# Patient Record
Sex: Female | Born: 1975 | Race: Black or African American | Hispanic: No | Marital: Married | Smoking: Never smoker
Health system: Southern US, Community
[De-identification: ages and names within clinical notes are randomized; demographics above are authoritative.]

## PROBLEM LIST (undated history)

## (undated) DIAGNOSIS — E282 Polycystic ovarian syndrome: Secondary | ICD-10-CM

## (undated) DIAGNOSIS — D649 Anemia, unspecified: Secondary | ICD-10-CM

## (undated) HISTORY — DX: Polycystic ovarian syndrome: E28.2

---

## 1999-07-30 ENCOUNTER — Encounter: Admission: RE | Admit: 1999-07-30 | Discharge: 1999-07-30 | Payer: Self-pay | Admitting: Family Medicine

## 1999-07-30 ENCOUNTER — Encounter: Payer: Self-pay | Admitting: Family Medicine

## 1999-12-04 ENCOUNTER — Encounter: Admission: RE | Admit: 1999-12-04 | Discharge: 1999-12-04 | Payer: Self-pay | Admitting: Oncology

## 1999-12-04 ENCOUNTER — Encounter: Payer: Self-pay | Admitting: Gastroenterology

## 1999-12-24 ENCOUNTER — Ambulatory Visit (HOSPITAL_COMMUNITY): Admission: RE | Admit: 1999-12-24 | Discharge: 1999-12-24 | Payer: Self-pay | Admitting: Gastroenterology

## 1999-12-24 ENCOUNTER — Encounter: Payer: Self-pay | Admitting: Gastroenterology

## 2000-01-21 ENCOUNTER — Encounter: Payer: Self-pay | Admitting: Gastroenterology

## 2000-01-21 ENCOUNTER — Encounter: Admission: RE | Admit: 2000-01-21 | Discharge: 2000-01-21 | Payer: Self-pay | Admitting: Gastroenterology

## 2001-06-22 ENCOUNTER — Encounter: Admission: RE | Admit: 2001-06-22 | Discharge: 2001-06-22 | Payer: Self-pay

## 2001-08-01 ENCOUNTER — Other Ambulatory Visit: Admission: RE | Admit: 2001-08-01 | Discharge: 2001-08-01 | Payer: Self-pay | Admitting: Family Medicine

## 2001-09-01 ENCOUNTER — Ambulatory Visit (HOSPITAL_COMMUNITY): Admission: RE | Admit: 2001-09-01 | Discharge: 2001-09-01 | Payer: Self-pay

## 2001-11-24 ENCOUNTER — Ambulatory Visit (HOSPITAL_COMMUNITY): Admission: RE | Admit: 2001-11-24 | Discharge: 2001-11-24 | Payer: Self-pay

## 2002-02-12 ENCOUNTER — Ambulatory Visit (HOSPITAL_COMMUNITY): Admission: RE | Admit: 2002-02-12 | Discharge: 2002-02-12 | Payer: Self-pay

## 2002-04-16 ENCOUNTER — Encounter (HOSPITAL_COMMUNITY): Admission: RE | Admit: 2002-04-16 | Discharge: 2002-04-16 | Payer: Self-pay

## 2002-04-19 ENCOUNTER — Inpatient Hospital Stay (HOSPITAL_COMMUNITY): Admission: AD | Admit: 2002-04-19 | Discharge: 2002-04-22 | Payer: Self-pay

## 2004-03-24 ENCOUNTER — Other Ambulatory Visit: Admission: RE | Admit: 2004-03-24 | Discharge: 2004-03-24 | Payer: Self-pay | Admitting: Family Medicine

## 2004-08-03 ENCOUNTER — Other Ambulatory Visit: Admission: RE | Admit: 2004-08-03 | Discharge: 2004-08-03 | Payer: Self-pay | Admitting: Family Medicine

## 2005-04-02 ENCOUNTER — Other Ambulatory Visit: Admission: RE | Admit: 2005-04-02 | Discharge: 2005-04-02 | Payer: Self-pay | Admitting: Family Medicine

## 2006-05-13 ENCOUNTER — Other Ambulatory Visit: Admission: RE | Admit: 2006-05-13 | Discharge: 2006-05-13 | Payer: Self-pay | Admitting: Family Medicine

## 2007-03-06 ENCOUNTER — Other Ambulatory Visit: Admission: RE | Admit: 2007-03-06 | Discharge: 2007-03-06 | Payer: Self-pay | Admitting: Obstetrics and Gynecology

## 2007-09-19 ENCOUNTER — Other Ambulatory Visit: Admission: RE | Admit: 2007-09-19 | Discharge: 2007-09-19 | Payer: Self-pay | Admitting: Obstetrics and Gynecology

## 2008-06-14 ENCOUNTER — Other Ambulatory Visit: Admission: RE | Admit: 2008-06-14 | Discharge: 2008-06-14 | Payer: Self-pay | Admitting: Obstetrics and Gynecology

## 2008-09-18 ENCOUNTER — Encounter: Admission: RE | Admit: 2008-09-18 | Discharge: 2008-09-18 | Payer: Self-pay | Admitting: Gastroenterology

## 2008-12-17 ENCOUNTER — Other Ambulatory Visit: Admission: RE | Admit: 2008-12-17 | Discharge: 2008-12-17 | Payer: Self-pay | Admitting: Obstetrics and Gynecology

## 2009-06-17 ENCOUNTER — Other Ambulatory Visit: Admission: RE | Admit: 2009-06-17 | Discharge: 2009-06-17 | Payer: Self-pay | Admitting: Nurse Practitioner

## 2010-01-16 ENCOUNTER — Other Ambulatory Visit: Admission: RE | Admit: 2010-01-16 | Discharge: 2010-01-16 | Payer: Self-pay | Admitting: Obstetrics and Gynecology

## 2010-08-16 ENCOUNTER — Inpatient Hospital Stay (HOSPITAL_COMMUNITY)
Admission: AD | Admit: 2010-08-16 | Discharge: 2010-08-18 | DRG: 766 | Disposition: A | Discharge status: Home / Self Care | Payer: 59 | Source: Ambulatory Visit | Attending: Obstetrics and Gynecology | Admitting: Obstetrics and Gynecology

## 2010-08-16 ENCOUNTER — Other Ambulatory Visit: Payer: Self-pay | Admitting: Obstetrics and Gynecology

## 2010-08-16 DIAGNOSIS — O09529 Supervision of elderly multigravida, unspecified trimester: Secondary | ICD-10-CM | POA: Diagnosis present

## 2010-08-16 DIAGNOSIS — O9902 Anemia complicating childbirth: Secondary | ICD-10-CM | POA: Diagnosis present

## 2010-08-16 DIAGNOSIS — D649 Anemia, unspecified: Secondary | ICD-10-CM | POA: Diagnosis present

## 2010-08-16 LAB — CBC
HCT: 35.9 % — ABNORMAL LOW (ref 36.0–46.0)
Hemoglobin: 11.8 g/dL — ABNORMAL LOW (ref 12.0–15.0)
MCH: 27.7 pg (ref 26.0–34.0)
MCHC: 32.9 g/dL (ref 30.0–36.0)
MCV: 84.3 fL (ref 78.0–100.0)
Platelets: 147 K/uL — ABNORMAL LOW (ref 150–400)
RBC: 4.26 MIL/uL (ref 3.87–5.11)
RDW: 16.5 % — ABNORMAL HIGH (ref 11.5–15.5)
WBC: 5.6 K/uL (ref 4.0–10.5)

## 2010-08-16 LAB — SYPHILIS: RPR W/REFLEX TO RPR TITER AND TREPONEMAL ANTIBODIES, TRADITIONAL SCREENING AND DIAGNOSIS ALGORITHM: RPR Ser Ql: NONREACTIVE

## 2010-08-17 LAB — CBC
Hemoglobin: 7.5 g/dL — ABNORMAL LOW (ref 12.0–15.0)
MCHC: 32.9 g/dL (ref 30.0–36.0)
Platelets: 121 10*3/uL — ABNORMAL LOW (ref 150–400)

## 2010-08-25 ENCOUNTER — Inpatient Hospital Stay (HOSPITAL_COMMUNITY)
Admission: AD | Admit: 2010-08-25 | Discharge: 2010-08-25 | Disposition: A | Discharge status: Home / Self Care | Payer: 59 | Source: Ambulatory Visit | Attending: Obstetrics and Gynecology | Admitting: Obstetrics and Gynecology

## 2010-08-25 DIAGNOSIS — R300 Dysuria: Secondary | ICD-10-CM | POA: Insufficient documentation

## 2010-08-25 LAB — CBC
HCT: 23.2 % — ABNORMAL LOW (ref 36.0–46.0)
Platelets: 283 10*3/uL (ref 150–400)
RDW: 16.1 % — ABNORMAL HIGH (ref 11.5–15.5)
WBC: 7.8 10*3/uL (ref 4.0–10.5)

## 2010-08-25 LAB — URINALYSIS, ROUTINE W REFLEX MICROSCOPIC
Glucose, UA: NEGATIVE mg/dL
Nitrite: NEGATIVE
Protein, ur: NEGATIVE mg/dL
Urobilinogen, UA: 0.2 mg/dL (ref 0.0–1.0)

## 2010-08-26 LAB — URINE CULTURE: Culture  Setup Time: 201206052018

## 2010-09-03 NOTE — Op Note (Signed)
Leah Robertson, Leah Robertson            ACCOUNT NO.:  [id number]  MEDICAL RECORD NO.:  [id number]           PATIENT TYPE:  I  LOCATION:  9102                          FACILITY:  [facility]  PHYSICIAN:  Patsy Baltimore, MD     DATE OF BIRTH:  1975-11-23  DATE OF PROCEDURE:  08/16/2010 DATE OF DISCHARGE:                              OPERATIVE REPORT   PREOPERATIVE DIAGNOSIS:  Failure to progress.  POSTOPERATIVE DIAGNOSIS:  Failure to progress.  PROCEDURE PERFORMED:  A primary low transverse cesarean section.  FINDINGS:  Full-term live female child, cephalic presentation, right occiput transverse.  Apgars 8 and 9.  One loose body cord x1.  Moderate meconium.  Infant weight 7 pounds 7 ounces.  Time of delivery 1848.  ESTIMATED BLOOD LOSS:  1200 mL.  IV FLUIDS:  2700 mL.  URINE:  Initially pink tinged, then clear yellow, total 75 mL.  Leah Robertson is a 35 year old gravida 2, para 1-0-0-1 who presented to the MAU with painful contractions and vaginal bleeding. She reported good fetal movement, no leakage of fluid.  On exam, she was 1-2 cm dilated, 50% effaced and -2 station.  The fetal heart tracing was noted to have some variable decelerations.  She was contracting.  She was therefore admitted in labor, augmented with Pitocin.  The fetal heart tracing continued to have intermittent variable decelerations as well as some late.  She was started on Pitocin, however, the Pitocin had to be turned off as resuscitative measures were done.  She progressed to 5 cm and her membranes were ruptured.  She received an epidural and an IUPC and FSE were placed.  The Pitocin never went higher then 5 milliunits per minute.  After this, she progressed to 7 cm and stayed centimeters for 3 hours.  She continued to have repetitive variable decelerations in spite of an amnioinfusion.  More importantly, her cervix thickened and she arrested in dilation. Informed consent was obtained from the patient for a  cesarean section.  The patient verbalized understanding of the sequence of events and gave her consent to the procedure.  The risks of pain, bleeding, infection, damage to internal organs including bladder, bowel, ureter, nerves and vessels was described in detail as well as blood clots and wound infection.  She was given Ancef for infection prophylaxis and she had sequential compression devices for DVT prophylaxis.  Her epidural was bolused for the cesarean section and she was taken to the operating room.  She was placed in the dorsal supine position with a leftward tilt.  She had a Foley catheter that was in her bladder from the labor suite.  At this point, a Foley catheter started draining some pink-tinged urine.  Prior to starting the procedure, the fetal heart rate was in the 140s.  She was prepped and draped in the usual sterile fashion.  A time-out was called and we began the procedure.  The level of her epidural was tested and found to be adequate prior to incision.  A Pfannenstiel skin incision was made, 2 fingerbreadths above the pubic symphysis with the scalpel and carried down to the underlying fascia with the  Bovie.  The bleeding vessels were cauterized as we proceeded.  The subcutaneous fat was cleared off the fascia.  The fascia was nicked in the midline and the fascial entry was extended bilaterally in a curvilinear fashion by tenting upwards with the Kelly clamp and incising with the Bovie.  The inferior aspects of the fascial flap was grasped on either side of the midline with 2 Kocher clamps and underlying pyramidalis muscle and rectus muscle was dissected off down to the pubic bone.  The same was done superiorly.  The rectus muscle was separated in the midline and the peritoneum was entered bluntly.  At this point, an assessment was made of the gravid uterus. There were no adhesions from the uterus to the anterior abdominal wall. There was no bowel in the way.  The  incision was deemed adequate for infant delivery.  A bladder flap was made by taking down the external layer of the vesicouterine reflection.  The Alexis O retractor was then inserted and deployed.  The bladder blade was inserted and used to hold down the bladder.  The uterine incision was made.  The bladder blade was removed.  The infant's head was delivered atraumatically and the rest of the body with fundal pressure.  There was a one loop of umbilical cord wrapped around the infant.  This was reduced easily.  The nose and mouth were suctioned.  The cord was clamped and cut and the baby was handed off to the awaiting pediatricians.  Cord blood was obtained.  The placenta was manually extracted.  The uterus was then cleared of all clots and debris.  Uterine closure was done with 0 chromic in 2  layers. Further bleeding vessels were tied off with figure-of-eight sutures and incision was hemostatic.  Irrigation was done with a small amount of lukewarm normal saline.  Small sheet of Surgicel was used to promote hemostasis along the incision.  The peritoneum was then reapproximated with 2-0 Vicryl.  The fascia was closed with 0 Vicryl in a running continuous fashion.  The subcutaneous tissue was reapproximated with 0 Vicryl as well.  The skin was closed with 4-0 Vicryl in a subcuticular manner.  Dermabond was used to reinforce the closure.  The patient tolerated the procedure well.  At the end, the sponge, lap, needle and instrument counts were correct x2.  The were no complications.  The urine was clear yellow in the Foley catheter.  She was transferred to the PACU in stable condition.          ______________________________ Patsy Baltimore, MD     CO/MEDQ  D:  08/16/2010  T:  08/17/2010  Job:  [id number]  Electronically Signed by Patsy Baltimore MD on 09/03/2010 09:59:14 AM

## 2010-09-25 NOTE — Discharge Summary (Signed)
NAMEICEL, CASTLES            ACCOUNT NO.:  [id number]  MEDICAL RECORD NO.:  [id number]  LOCATION:  9102                          FACILITY:  [facility]  PHYSICIAN:  Patsy Baltimore, MD     DATE OF BIRTH:  06/09/1975  DATE OF ADMISSION:  08/16/2010 DATE OF DISCHARGE:  08/18/2010                              DISCHARGE SUMMARY   PRINCIPAL DIAGNOSES: 1. Term intrauterine pregnancy in latent labor. 2. Advanced maternal age. 3. Herpes. 4. Sickle cell trait. 5. Anemia.  PROCEDURE PERFORMED:  A primary low-transverse cesarean section.  Leah Robertson is a 35 year old gravida 2, para 1-0-0-1 at 40 weeks and 2 days' gestation dated by a 7-week ultrasound, who was admitted to the Labor and Delivery suite with painful contractions and slight vaginal bleeding.  She reported good fetal movement, no leakage of fluid.  Her pregnancy was complicated by advanced maternal age.  She declined an amniocentesis.  She also had herpes and had been on Valtrex since 36 weeks for suppression.  She also has sickle cell trait and her husband was negative.  She had anemia and was also taking iron.  PREVIOUS OBSTETRIC HISTORY:  One normal spontaneous vaginal delivery at 41 weeks in 2004, that baby weighed 7 pounds 4 ounces.  LABORATORY DATA:  She was group B strep negative, hepatitis B surface antigen negative.  Her 1-hour glucose was 100.  She is rubella immune. Platelet count on Jul 28, 2010, was 148.  Blood type O+, antibody screen negative, HIV negative, GC/Chlamydia negative, syphilis negative.  On arrival, her vital signs were stable.  She was uncomfortable as time progressed with more painful contractions.  Therefore, she was admitted in latent labor.  She was started on Pitocin for augmentation and received an epidural for pain management.  She made progress to about 6-7 cm dilation.  She had repetitive variable decelerations.  An amnioinfusion was started to help resolve this.  She had been  given oxygen off and on. The Pitocin was discontinued.  After a period of recovery, the Pitocin was restarted; however, it had to be stopped again due to a nonreassuring fetal heart tracing.  She arrested at 7 cm of dilation and at that point, a diagnosis of failure to progress was made as well as fetal intolerance of the Pitocin.  Informed consent was obtained for a primary low-transverse cesarean section and her epidural was bolused accordingly.  She underwent an uneventful cesarean section.  The estimated blood loss was 1200 mL.  Baby was a full-term live female child in cephalic presentation, Apgars were 8 and 9, 1 loose body cord.  There was moderate meconium.  The baby weighed 7 pounds 7 ounces. Postoperatively, she did fine.  Her pain was controlled with medications.  She reported bottle-feeding.  Her postop hematocrit was 22.5.  Therefore, she was started on iron.  She desired infant circumcision and that was done in the hospital.  She was discharged home per request on postop day #2 because she was feeling okay and wanted [place] home.  She was given strict instructions for monitoring her wound, told to report any fever, chills, or unusual discharge from the wound.  She was discharged home with prescriptions  for iron, Motrin, Percocet, and told to follow up in 2 weeks for an incision check.  There were no unresolved issues at the time of discharge and she verbalized an understanding of the discharge instructions.          ______________________________ Patsy Baltimore, MD     CO/MEDQ  D:  09/21/2010  T:  09/22/2010  Job:  [id number]  Electronically Signed by Patsy Baltimore MD on 09/25/2010 10:33:33 AM

## 2011-02-10 ENCOUNTER — Other Ambulatory Visit: Payer: Self-pay | Admitting: Obstetrics and Gynecology

## 2011-02-10 ENCOUNTER — Other Ambulatory Visit (HOSPITAL_COMMUNITY)
Admission: RE | Admit: 2011-02-10 | Discharge: 2011-02-10 | Disposition: A | Discharge status: Home / Self Care | Payer: Managed Care, Other (non HMO) | Source: Ambulatory Visit | Attending: Obstetrics and Gynecology | Admitting: Obstetrics and Gynecology

## 2011-02-10 DIAGNOSIS — Z01419 Encounter for gynecological examination (general) (routine) without abnormal findings: Secondary | ICD-10-CM | POA: Insufficient documentation

## 2011-07-20 DIAGNOSIS — L650 Telogen effluvium: Secondary | ICD-10-CM | POA: Insufficient documentation

## 2012-03-17 ENCOUNTER — Other Ambulatory Visit: Payer: Self-pay | Admitting: Obstetrics and Gynecology

## 2012-03-17 ENCOUNTER — Other Ambulatory Visit (HOSPITAL_COMMUNITY)
Admission: RE | Admit: 2012-03-17 | Discharge: 2012-03-17 | Disposition: A | Discharge status: Home / Self Care | Payer: Managed Care, Other (non HMO) | Source: Ambulatory Visit | Attending: Obstetrics and Gynecology | Admitting: Obstetrics and Gynecology

## 2012-03-17 DIAGNOSIS — N6320 Unspecified lump in the left breast, unspecified quadrant: Secondary | ICD-10-CM

## 2012-03-17 DIAGNOSIS — Z01419 Encounter for gynecological examination (general) (routine) without abnormal findings: Secondary | ICD-10-CM | POA: Insufficient documentation

## 2012-03-17 DIAGNOSIS — Z1151 Encounter for screening for human papillomavirus (HPV): Secondary | ICD-10-CM | POA: Insufficient documentation

## 2012-03-24 ENCOUNTER — Ambulatory Visit
Admission: RE | Admit: 2012-03-24 | Discharge: 2012-03-24 | Disposition: A | Discharge status: Home / Self Care | Payer: Managed Care, Other (non HMO) | Source: Ambulatory Visit | Attending: Obstetrics and Gynecology | Admitting: Obstetrics and Gynecology

## 2012-03-24 ENCOUNTER — Other Ambulatory Visit: Payer: Self-pay | Admitting: Obstetrics and Gynecology

## 2012-03-24 ENCOUNTER — Ambulatory Visit
Admission: RE | Admit: 2012-03-24 | Discharge: 2012-03-24 | Disposition: A | Discharge status: Home / Self Care | Payer: 59 | Source: Ambulatory Visit | Attending: Obstetrics and Gynecology | Admitting: Obstetrics and Gynecology

## 2012-03-24 DIAGNOSIS — N6320 Unspecified lump in the left breast, unspecified quadrant: Secondary | ICD-10-CM

## 2012-04-05 ENCOUNTER — Other Ambulatory Visit (HOSPITAL_COMMUNITY)
Admission: RE | Admit: 2012-04-05 | Discharge: 2012-04-05 | Disposition: A | Discharge status: Home / Self Care | Payer: Managed Care, Other (non HMO) | Source: Ambulatory Visit | Attending: Obstetrics and Gynecology | Admitting: Obstetrics and Gynecology

## 2012-10-30 ENCOUNTER — Other Ambulatory Visit: Payer: Self-pay | Admitting: Family Medicine

## 2012-10-30 DIAGNOSIS — N6310 Unspecified lump in the right breast, unspecified quadrant: Secondary | ICD-10-CM

## 2012-12-01 ENCOUNTER — Ambulatory Visit
Admission: RE | Admit: 2012-12-01 | Discharge: 2012-12-01 | Disposition: A | Discharge status: Home / Self Care | Payer: 59 | Source: Ambulatory Visit | Attending: Family Medicine | Admitting: Family Medicine

## 2012-12-01 DIAGNOSIS — N6310 Unspecified lump in the right breast, unspecified quadrant: Secondary | ICD-10-CM

## 2013-04-09 ENCOUNTER — Other Ambulatory Visit (HOSPITAL_COMMUNITY)
Admission: RE | Admit: 2013-04-09 | Discharge: 2013-04-09 | Disposition: A | Discharge status: Home / Self Care | Payer: 59 | Source: Ambulatory Visit | Attending: Obstetrics and Gynecology | Admitting: Obstetrics and Gynecology

## 2013-04-09 ENCOUNTER — Other Ambulatory Visit: Payer: Self-pay | Admitting: Obstetrics and Gynecology

## 2013-04-09 DIAGNOSIS — R87810 Cervical high risk human papillomavirus (HPV) DNA test positive: Secondary | ICD-10-CM | POA: Insufficient documentation

## 2013-04-09 DIAGNOSIS — Z1151 Encounter for screening for human papillomavirus (HPV): Secondary | ICD-10-CM | POA: Insufficient documentation

## 2013-04-09 DIAGNOSIS — Z124 Encounter for screening for malignant neoplasm of cervix: Secondary | ICD-10-CM | POA: Insufficient documentation

## 2013-05-03 ENCOUNTER — Other Ambulatory Visit: Payer: Self-pay | Admitting: Obstetrics and Gynecology

## 2014-02-27 ENCOUNTER — Other Ambulatory Visit: Payer: Self-pay | Admitting: Obstetrics and Gynecology

## 2014-02-27 DIAGNOSIS — N6310 Unspecified lump in the right breast, unspecified quadrant: Secondary | ICD-10-CM

## 2014-02-27 DIAGNOSIS — N6320 Unspecified lump in the left breast, unspecified quadrant: Secondary | ICD-10-CM

## 2014-03-11 DIAGNOSIS — L669 Cicatricial alopecia, unspecified: Secondary | ICD-10-CM | POA: Insufficient documentation

## 2014-03-12 ENCOUNTER — Ambulatory Visit
Admission: RE | Admit: 2014-03-12 | Discharge: 2014-03-12 | Disposition: A | Discharge status: Home / Self Care | Payer: 59 | Source: Ambulatory Visit | Attending: Obstetrics and Gynecology | Admitting: Obstetrics and Gynecology

## 2014-03-12 DIAGNOSIS — N6310 Unspecified lump in the right breast, unspecified quadrant: Secondary | ICD-10-CM

## 2014-05-01 ENCOUNTER — Other Ambulatory Visit: Payer: Self-pay | Admitting: Obstetrics and Gynecology

## 2014-05-01 ENCOUNTER — Other Ambulatory Visit (HOSPITAL_COMMUNITY)
Admission: RE | Admit: 2014-05-01 | Discharge: 2014-05-01 | Disposition: A | Discharge status: Home / Self Care | Payer: 59 | Source: Ambulatory Visit | Attending: Obstetrics and Gynecology | Admitting: Obstetrics and Gynecology

## 2014-05-01 DIAGNOSIS — Z1151 Encounter for screening for human papillomavirus (HPV): Secondary | ICD-10-CM | POA: Insufficient documentation

## 2014-05-01 DIAGNOSIS — R87810 Cervical high risk human papillomavirus (HPV) DNA test positive: Secondary | ICD-10-CM | POA: Insufficient documentation

## 2014-05-01 DIAGNOSIS — Z01411 Encounter for gynecological examination (general) (routine) with abnormal findings: Secondary | ICD-10-CM | POA: Insufficient documentation

## 2014-05-03 LAB — CYTOLOGY - PAP

## 2014-06-27 ENCOUNTER — Other Ambulatory Visit: Payer: Self-pay | Admitting: Family Medicine

## 2014-06-27 DIAGNOSIS — R1031 Right lower quadrant pain: Secondary | ICD-10-CM

## 2014-07-04 ENCOUNTER — Ambulatory Visit
Admission: RE | Admit: 2014-07-04 | Discharge: 2014-07-04 | Disposition: A | Discharge status: Home / Self Care | Payer: Managed Care, Other (non HMO) | Source: Ambulatory Visit | Attending: Family Medicine | Admitting: Family Medicine

## 2014-07-04 DIAGNOSIS — R1031 Right lower quadrant pain: Secondary | ICD-10-CM

## 2014-07-04 MED ORDER — IOPAMIDOL (ISOVUE-300) INJECTION 61%
100.0000 mL | Freq: Once | INTRAVENOUS | Status: AC | PRN
  Administered 2014-07-04: 100 mL via INTRAVENOUS

## 2014-12-26 DIAGNOSIS — E611 Iron deficiency: Secondary | ICD-10-CM | POA: Insufficient documentation

## 2015-01-09 ENCOUNTER — Other Ambulatory Visit: Payer: Self-pay | Admitting: Family Medicine

## 2015-01-09 DIAGNOSIS — N2889 Other specified disorders of kidney and ureter: Secondary | ICD-10-CM

## 2015-01-27 ENCOUNTER — Ambulatory Visit
Admission: RE | Admit: 2015-01-27 | Discharge: 2015-01-27 | Disposition: A | Discharge status: Home / Self Care | Payer: 59 | Source: Ambulatory Visit | Attending: Family Medicine | Admitting: Family Medicine

## 2015-01-27 DIAGNOSIS — N2889 Other specified disorders of kidney and ureter: Secondary | ICD-10-CM

## 2015-03-06 ENCOUNTER — Other Ambulatory Visit: Payer: Self-pay

## 2015-03-06 DIAGNOSIS — Z1231 Encounter for screening mammogram for malignant neoplasm of breast: Secondary | ICD-10-CM

## 2015-03-26 ENCOUNTER — Ambulatory Visit
Admission: RE | Admit: 2015-03-26 | Discharge: 2015-03-26 | Disposition: A | Discharge status: Home / Self Care | Payer: 59 | Source: Ambulatory Visit

## 2015-03-26 DIAGNOSIS — Z1231 Encounter for screening mammogram for malignant neoplasm of breast: Secondary | ICD-10-CM

## 2015-05-02 ENCOUNTER — Other Ambulatory Visit: Payer: Self-pay | Admitting: Obstetrics and Gynecology

## 2015-05-02 ENCOUNTER — Other Ambulatory Visit (HOSPITAL_COMMUNITY)
Admission: RE | Admit: 2015-05-02 | Discharge: 2015-05-02 | Disposition: A | Discharge status: Home / Self Care | Payer: 59 | Source: Ambulatory Visit | Attending: Obstetrics and Gynecology | Admitting: Obstetrics and Gynecology

## 2015-05-02 DIAGNOSIS — Z01411 Encounter for gynecological examination (general) (routine) with abnormal findings: Secondary | ICD-10-CM | POA: Insufficient documentation

## 2015-05-02 DIAGNOSIS — Z1151 Encounter for screening for human papillomavirus (HPV): Secondary | ICD-10-CM | POA: Diagnosis present

## 2015-05-05 LAB — CYTOLOGY - PAP

## 2015-07-15 ENCOUNTER — Other Ambulatory Visit: Payer: Self-pay | Admitting: Obstetrics and Gynecology

## 2016-05-18 DIAGNOSIS — R6883 Chills (without fever): Secondary | ICD-10-CM | POA: Diagnosis not present

## 2016-05-18 DIAGNOSIS — R5383 Other fatigue: Secondary | ICD-10-CM | POA: Diagnosis not present

## 2016-05-24 ENCOUNTER — Other Ambulatory Visit: Payer: Self-pay | Admitting: Family Medicine

## 2016-05-24 DIAGNOSIS — Z1231 Encounter for screening mammogram for malignant neoplasm of breast: Secondary | ICD-10-CM

## 2016-06-17 ENCOUNTER — Ambulatory Visit
Admission: RE | Admit: 2016-06-17 | Discharge: 2016-06-17 | Disposition: A | Discharge status: Home / Self Care | Payer: 59 | Source: Ambulatory Visit | Attending: Family Medicine | Admitting: Family Medicine

## 2016-06-17 DIAGNOSIS — Z1231 Encounter for screening mammogram for malignant neoplasm of breast: Secondary | ICD-10-CM

## 2016-06-18 ENCOUNTER — Other Ambulatory Visit: Payer: Self-pay | Admitting: Family Medicine

## 2016-06-18 DIAGNOSIS — R928 Other abnormal and inconclusive findings on diagnostic imaging of breast: Secondary | ICD-10-CM

## 2016-06-20 HISTORY — PX: BREAST BIOPSY: SHX20

## 2016-06-23 ENCOUNTER — Ambulatory Visit
Admission: RE | Admit: 2016-06-23 | Discharge: 2016-06-23 | Disposition: A | Discharge status: Home / Self Care | Payer: 59 | Source: Ambulatory Visit | Attending: Family Medicine | Admitting: Family Medicine

## 2016-06-23 ENCOUNTER — Other Ambulatory Visit: Payer: Self-pay | Admitting: Family Medicine

## 2016-06-23 DIAGNOSIS — N6320 Unspecified lump in the left breast, unspecified quadrant: Secondary | ICD-10-CM

## 2016-06-23 DIAGNOSIS — R590 Localized enlarged lymph nodes: Secondary | ICD-10-CM | POA: Diagnosis not present

## 2016-06-23 DIAGNOSIS — N6322 Unspecified lump in the left breast, upper inner quadrant: Secondary | ICD-10-CM | POA: Diagnosis not present

## 2016-06-23 DIAGNOSIS — D242 Benign neoplasm of left breast: Secondary | ICD-10-CM | POA: Diagnosis not present

## 2016-06-23 DIAGNOSIS — R928 Other abnormal and inconclusive findings on diagnostic imaging of breast: Secondary | ICD-10-CM

## 2016-06-23 DIAGNOSIS — N6489 Other specified disorders of breast: Secondary | ICD-10-CM | POA: Diagnosis not present

## 2016-07-07 DIAGNOSIS — N951 Menopausal and female climacteric states: Secondary | ICD-10-CM | POA: Diagnosis not present

## 2016-07-13 DIAGNOSIS — E559 Vitamin D deficiency, unspecified: Secondary | ICD-10-CM | POA: Diagnosis not present

## 2016-07-13 DIAGNOSIS — N951 Menopausal and female climacteric states: Secondary | ICD-10-CM | POA: Diagnosis not present

## 2016-07-13 DIAGNOSIS — E539 Vitamin B deficiency, unspecified: Secondary | ICD-10-CM | POA: Diagnosis not present

## 2016-07-19 DIAGNOSIS — E559 Vitamin D deficiency, unspecified: Secondary | ICD-10-CM | POA: Diagnosis not present

## 2016-07-19 DIAGNOSIS — E539 Vitamin B deficiency, unspecified: Secondary | ICD-10-CM | POA: Diagnosis not present

## 2016-07-26 DIAGNOSIS — E559 Vitamin D deficiency, unspecified: Secondary | ICD-10-CM | POA: Diagnosis not present

## 2016-07-26 DIAGNOSIS — R5383 Other fatigue: Secondary | ICD-10-CM | POA: Diagnosis not present

## 2016-08-02 DIAGNOSIS — R5383 Other fatigue: Secondary | ICD-10-CM | POA: Diagnosis not present

## 2016-08-02 DIAGNOSIS — E559 Vitamin D deficiency, unspecified: Secondary | ICD-10-CM | POA: Diagnosis not present

## 2016-08-09 DIAGNOSIS — E559 Vitamin D deficiency, unspecified: Secondary | ICD-10-CM | POA: Diagnosis not present

## 2016-08-17 DIAGNOSIS — E559 Vitamin D deficiency, unspecified: Secondary | ICD-10-CM | POA: Diagnosis not present

## 2016-08-23 DIAGNOSIS — E559 Vitamin D deficiency, unspecified: Secondary | ICD-10-CM | POA: Diagnosis not present

## 2016-08-30 DIAGNOSIS — E559 Vitamin D deficiency, unspecified: Secondary | ICD-10-CM | POA: Diagnosis not present

## 2016-09-06 DIAGNOSIS — E559 Vitamin D deficiency, unspecified: Secondary | ICD-10-CM | POA: Diagnosis not present

## 2016-09-23 DIAGNOSIS — E559 Vitamin D deficiency, unspecified: Secondary | ICD-10-CM | POA: Diagnosis not present

## 2016-10-17 DIAGNOSIS — R51 Headache: Secondary | ICD-10-CM | POA: Diagnosis not present

## 2016-12-03 DIAGNOSIS — L680 Hirsutism: Secondary | ICD-10-CM | POA: Diagnosis not present

## 2016-12-03 DIAGNOSIS — L659 Nonscarring hair loss, unspecified: Secondary | ICD-10-CM | POA: Diagnosis not present

## 2016-12-15 DIAGNOSIS — Z23 Encounter for immunization: Secondary | ICD-10-CM | POA: Diagnosis not present

## 2017-01-03 DIAGNOSIS — L219 Seborrheic dermatitis, unspecified: Secondary | ICD-10-CM | POA: Diagnosis not present

## 2017-01-03 DIAGNOSIS — L658 Other specified nonscarring hair loss: Secondary | ICD-10-CM | POA: Diagnosis not present

## 2017-01-03 DIAGNOSIS — L668 Other cicatricial alopecia: Secondary | ICD-10-CM | POA: Diagnosis not present

## 2017-01-03 DIAGNOSIS — L089 Local infection of the skin and subcutaneous tissue, unspecified: Secondary | ICD-10-CM | POA: Diagnosis not present

## 2017-01-03 DIAGNOSIS — L659 Nonscarring hair loss, unspecified: Secondary | ICD-10-CM | POA: Diagnosis not present

## 2017-01-03 DIAGNOSIS — L821 Other seborrheic keratosis: Secondary | ICD-10-CM | POA: Diagnosis not present

## 2017-01-17 DIAGNOSIS — L669 Cicatricial alopecia, unspecified: Secondary | ICD-10-CM | POA: Diagnosis not present

## 2017-01-17 DIAGNOSIS — L658 Other specified nonscarring hair loss: Secondary | ICD-10-CM | POA: Diagnosis not present

## 2017-03-07 DIAGNOSIS — Z13220 Encounter for screening for lipoid disorders: Secondary | ICD-10-CM | POA: Diagnosis not present

## 2017-03-07 DIAGNOSIS — D649 Anemia, unspecified: Secondary | ICD-10-CM | POA: Diagnosis not present

## 2017-03-07 DIAGNOSIS — Z0000 Encounter for general adult medical examination without abnormal findings: Secondary | ICD-10-CM | POA: Diagnosis not present

## 2017-05-18 DIAGNOSIS — M791 Myalgia, unspecified site: Secondary | ICD-10-CM | POA: Diagnosis not present

## 2017-05-18 DIAGNOSIS — M79642 Pain in left hand: Secondary | ICD-10-CM | POA: Diagnosis not present

## 2017-06-01 ENCOUNTER — Other Ambulatory Visit: Payer: Self-pay | Admitting: Obstetrics and Gynecology

## 2017-06-01 ENCOUNTER — Other Ambulatory Visit (HOSPITAL_COMMUNITY)
Admission: RE | Admit: 2017-06-01 | Discharge: 2017-06-01 | Disposition: A | Discharge status: Home / Self Care | Payer: 59 | Source: Ambulatory Visit | Attending: Obstetrics and Gynecology | Admitting: Obstetrics and Gynecology

## 2017-06-01 DIAGNOSIS — Z01411 Encounter for gynecological examination (general) (routine) with abnormal findings: Secondary | ICD-10-CM | POA: Diagnosis not present

## 2017-06-01 DIAGNOSIS — N924 Excessive bleeding in the premenopausal period: Secondary | ICD-10-CM | POA: Diagnosis not present

## 2017-06-01 DIAGNOSIS — E282 Polycystic ovarian syndrome: Secondary | ICD-10-CM | POA: Diagnosis not present

## 2017-06-01 DIAGNOSIS — Z01419 Encounter for gynecological examination (general) (routine) without abnormal findings: Secondary | ICD-10-CM | POA: Diagnosis not present

## 2017-06-01 DIAGNOSIS — N870 Mild cervical dysplasia: Secondary | ICD-10-CM | POA: Diagnosis not present

## 2017-06-03 DIAGNOSIS — N924 Excessive bleeding in the premenopausal period: Secondary | ICD-10-CM | POA: Diagnosis not present

## 2017-06-06 DIAGNOSIS — D8989 Other specified disorders involving the immune mechanism, not elsewhere classified: Secondary | ICD-10-CM | POA: Diagnosis not present

## 2017-06-06 DIAGNOSIS — R768 Other specified abnormal immunological findings in serum: Secondary | ICD-10-CM | POA: Diagnosis not present

## 2017-06-07 LAB — CYTOLOGY - PAP
DIAGNOSIS: UNDETERMINED — AB
HPV (WINDOPATH): DETECTED — AB
HPV 16/18/45 GENOTYPING: NEGATIVE

## 2017-06-08 DIAGNOSIS — L811 Chloasma: Secondary | ICD-10-CM | POA: Diagnosis not present

## 2017-07-28 ENCOUNTER — Other Ambulatory Visit: Payer: Self-pay | Admitting: Family Medicine

## 2017-07-28 DIAGNOSIS — N6489 Other specified disorders of breast: Secondary | ICD-10-CM

## 2017-08-01 ENCOUNTER — Other Ambulatory Visit: Payer: Self-pay | Admitting: Radiology

## 2017-08-01 ENCOUNTER — Other Ambulatory Visit: Payer: Self-pay | Admitting: Family Medicine

## 2017-08-01 DIAGNOSIS — Z1231 Encounter for screening mammogram for malignant neoplasm of breast: Secondary | ICD-10-CM

## 2017-08-02 ENCOUNTER — Other Ambulatory Visit: Payer: 59

## 2017-08-02 ENCOUNTER — Ambulatory Visit: Payer: 59

## 2017-08-03 ENCOUNTER — Ambulatory Visit
Admission: RE | Admit: 2017-08-03 | Discharge: 2017-08-03 | Disposition: A | Discharge status: Home / Self Care | Payer: 59 | Source: Ambulatory Visit | Attending: Family Medicine | Admitting: Family Medicine

## 2017-08-03 DIAGNOSIS — Z1231 Encounter for screening mammogram for malignant neoplasm of breast: Secondary | ICD-10-CM

## 2017-08-18 DIAGNOSIS — R768 Other specified abnormal immunological findings in serum: Secondary | ICD-10-CM | POA: Diagnosis not present

## 2017-08-18 DIAGNOSIS — M791 Myalgia, unspecified site: Secondary | ICD-10-CM | POA: Diagnosis not present

## 2017-08-18 DIAGNOSIS — M2550 Pain in unspecified joint: Secondary | ICD-10-CM | POA: Diagnosis not present

## 2017-08-25 ENCOUNTER — Other Ambulatory Visit: Payer: Self-pay | Admitting: Obstetrics and Gynecology

## 2017-08-25 DIAGNOSIS — D260 Other benign neoplasm of cervix uteri: Secondary | ICD-10-CM | POA: Diagnosis not present

## 2017-08-25 DIAGNOSIS — Z3202 Encounter for pregnancy test, result negative: Secondary | ICD-10-CM | POA: Diagnosis not present

## 2017-09-08 ENCOUNTER — Encounter: Payer: Self-pay | Admitting: Neurology

## 2017-12-10 DIAGNOSIS — Z23 Encounter for immunization: Secondary | ICD-10-CM | POA: Diagnosis not present

## 2017-12-12 ENCOUNTER — Encounter: Payer: Self-pay | Admitting: Neurology

## 2017-12-12 ENCOUNTER — Ambulatory Visit: Payer: 59 | Admitting: Neurology

## 2017-12-12 VITALS — BP 106/62 | HR 98 | Ht 65.0 in | Wt 172.0 lb

## 2017-12-12 DIAGNOSIS — M79642 Pain in left hand: Secondary | ICD-10-CM

## 2017-12-12 NOTE — Progress Notes (Signed)
[facility] Clinic Note - Initial Visit   Date: 12/12/17  Leah Robertson MRN: [id number] DOB: 02-10-1976   Dear Dr.  Dorthy Cooler:  Thank you for your kind referral of Leah Robertson for consultation of left hand pain. Although her history is well known to you, please allow [place] to reiterate it for the purpose of our medical record. The patient was accompanied to the clinic by self.   History of Present Illness: Leah Robertson is a 42 y.o. right-handed African American female with PCOS presenting for evaluation of left hand pain.    Starting in 2017, she began having spells of achy pain at the base of the thumb, which would radiated intoher forearm. Pain was always associated around her menstrual cycle, occurring a few days before or after.  She did have any weakness, but moving the thumb to hold objects, would exacerbate pain.  Meloxicam and OTC NSAIDs would help.  Around the same time, she began having intermittent numbness/tingling over the thumb and forearm, lasting a few minutes, and self-resolved.  She has not experienced numbness or tingling in several months, but continues to have achy pain at the base of the thumb.  She does not have neck pain or radicular pain.  She denies any prior history of neurological symptoms such as vision loss, paresthesias of the face, lateralizing weakness, or imbalance.  She was found to have positive ANA for which she saw rheumatology and did not have symptoms of lupus.  She has not had any imaging of the hand and is scheduled to see rheumatology later this year.  She is frustrated by the lack of diagnosis.  She has contemplated restarting her oral contraceptive medication, as her pain correlates very strongly with her menstrual cycle.   Past Medical History:  Diagnosis Date  . PCOS (polycystic ovarian syndrome)     Past Surgical History:  Procedure Laterality Date  . CESAREAN SECTION       Medications:    Outpatient Encounter Medications as of 12/12/2017  Medication Sig  . clobetasol ointment (TEMOVATE) 0.05 %   . Multiple Vitamin (MULTIVITAMIN) tablet Take 1 tablet by mouth daily.   No facility-administered encounter medications on file as of 12/12/2017.      Allergies: No Known Allergies  Family History: Family History  Problem Relation Age of Onset  . Colon cancer Mother   . Prostate cancer Father   . Heart disease Father   . Healthy Sister   . Hypertension Brother     Social History: Social History   Tobacco Use  . Smoking status: Never Smoker  . Smokeless tobacco: Never Used  Substance Use Topics  . Alcohol use: Never    Frequency: Never  . Drug use: Never   Social History   Social History Narrative   She works as a Government social research officer.     She lives with husband and two children   Highest level of education:  BS    Review of Systems:  CONSTITUTIONAL: No fevers, chills, night sweats, or weight loss.   EYES: No visual changes or eye pain ENT: No hearing changes.  No history of nose bleeds.   RESPIRATORY: No cough, wheezing and shortness of breath.   CARDIOVASCULAR: Negative for chest pain, and palpitations.   GI: Negative for abdominal discomfort, blood in stools or black stools.  No recent change in bowel habits.   GU:  No history of incontinence.   MUSCLOSKELETAL: +history of joint pain or swelling.  No myalgias.   SKIN: Negative for lesions, rash, and itching.   HEMATOLOGY/ONCOLOGY: Negative for prolonged bleeding, bruising easily, and swollen nodes.  No history of cancer.   ENDOCRINE: Negative for cold or heat intolerance, polydipsia or goiter.   PSYCH:  No depression or anxiety symptoms.   NEURO: As Above.   Vital Signs:  BP 106/62   Pulse 98   Ht 5\' 5"  (1.651 m)   Wt 172 lb (78 kg)   SpO2 98%   BMI 28.62 kg/m   General Medical Exam:   General:  Well appearing, comfortable.   Eyes/ENT: see cranial nerve examination.   Neck: No masses  appreciated.  Full range of motion without tenderness.  No carotid bruits. Respiratory:  Clear to auscultation, good air entry bilaterally.   Cardiac:  Regular rate and rhythm, no murmur.   Extremities:  Localized tenderness over the thenar eminance on the left side.  No swelling or erythema. Skin:  No rashes or lesions.  Neurological Exam: MENTAL STATUS including orientation to time, place, person, recent and remote memory, attention span and concentration, language, and fund of knowledge is normal.  Speech is not dysarthric.  CRANIAL NERVES: II:  No visual field defects.  Unremarkable fundi.   III-IV-VI: Pupils equal round and reactive to light.  Normal conjugate, extra-ocular eye movements in all directions of gaze.  No nystagmus.  No ptosis.   V:  Normal facial sensation.    VII:  Normal facial symmetry and movements.  No pathologic facial reflexes.  VIII:  Normal hearing and vestibular function.   IX-X:  Normal palatal movement.   XI:  Normal shoulder shrug and head rotation.   XII:  Normal tongue strength and range of motion, no deviation or fasciculation.  MOTOR:  Motor strength is 5/5 throughout, including left APB and intrinsic hand muscles.  No atrophy, fasciculations or abnormal movements.  No pronator drift.  Tone is normal.    MSRs:  Right                                                                 Left brachioradialis 2+  brachioradialis 2+  biceps 2+  biceps 2+  triceps 2+  triceps 2+  patellar 2+  patellar 2+  ankle jerk 2+  ankle jerk 2+   SENSORY:  Normal and symmetric perception of light touch, pinprick, vibration, and proprioception.  Tinel's sign is negative.    COORDINATION/GAIT: Normal finger-to- nose-finger.  Intact rapid alternating movements bilaterally.  Gait narrow based and stable.    IMPRESSION: Leah Robertson is a 42 year-old referred for evaluation of left thenar pain and paresthesias of the forearm.  Several months ago, left arm paresthesias  self resolved, however she continues to have localized tenderness and pain at the base of the thumb.  There are no abnormalities on her neurological exam to suggest upper motor neuron dysfunction.  Specifically, no findings to suggestive demyelinating disease. She may have some nerve entrapment causing intermittent paresthesias, for which I have recommended nerve conduction study and EMG.  As the symptoms have resolved, she would like to continue to monitor and will call to schedule, if paresthesias return.  Regarding her localized achy thenar pain, this is most likely musculoskeletal in nature as it is not  characteristic for nerve pathology.   Thank you for allowing me to participate in patient's care.  If I can answer any additional questions, I would be pleased to do so.    Sincerely,    Pressley Barsky K. Posey Pronto, DO

## 2017-12-12 NOTE — Patient Instructions (Signed)
If you choose to proceed with the nerve conduction study of the left hand, please call my office to schedule

## 2017-12-19 DIAGNOSIS — L658 Other specified nonscarring hair loss: Secondary | ICD-10-CM | POA: Diagnosis not present

## 2017-12-19 DIAGNOSIS — L659 Nonscarring hair loss, unspecified: Secondary | ICD-10-CM | POA: Diagnosis not present

## 2017-12-19 DIAGNOSIS — L669 Cicatricial alopecia, unspecified: Secondary | ICD-10-CM | POA: Diagnosis not present

## 2018-01-02 IMAGING — MG 2D DIGITAL DIAGNOSTIC UNILATERAL LEFT MAMMOGRAM WITH CAD AND ADJ
6 series · 6 of 14 positions shown · non-contrast
Comparison: Previous exam(s).

CLINICAL DATA: Screening recall for a left breast asymmetry

EXAM:
2D DIGITAL DIAGNOSTIC LEFT MAMMOGRAM WITH CAD AND ADJUNCT TOMO
ULTRASOUND LEFT BREAST

[L MLO synth-2D]
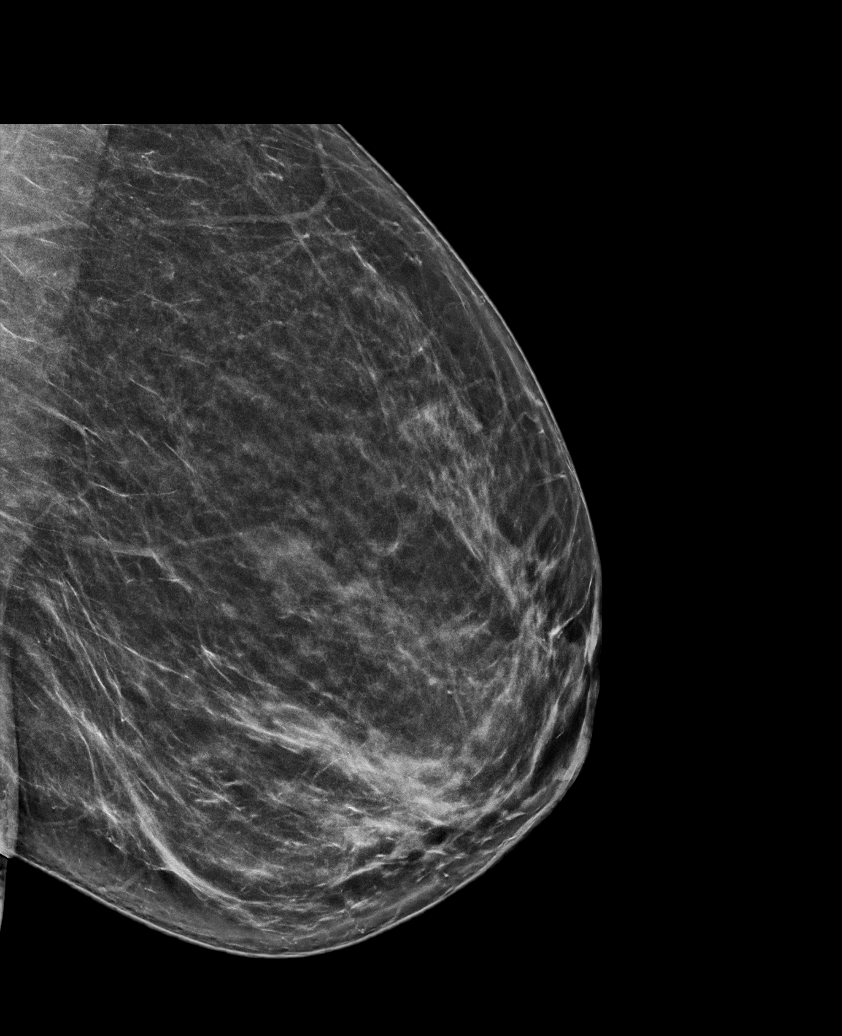

[L CC]
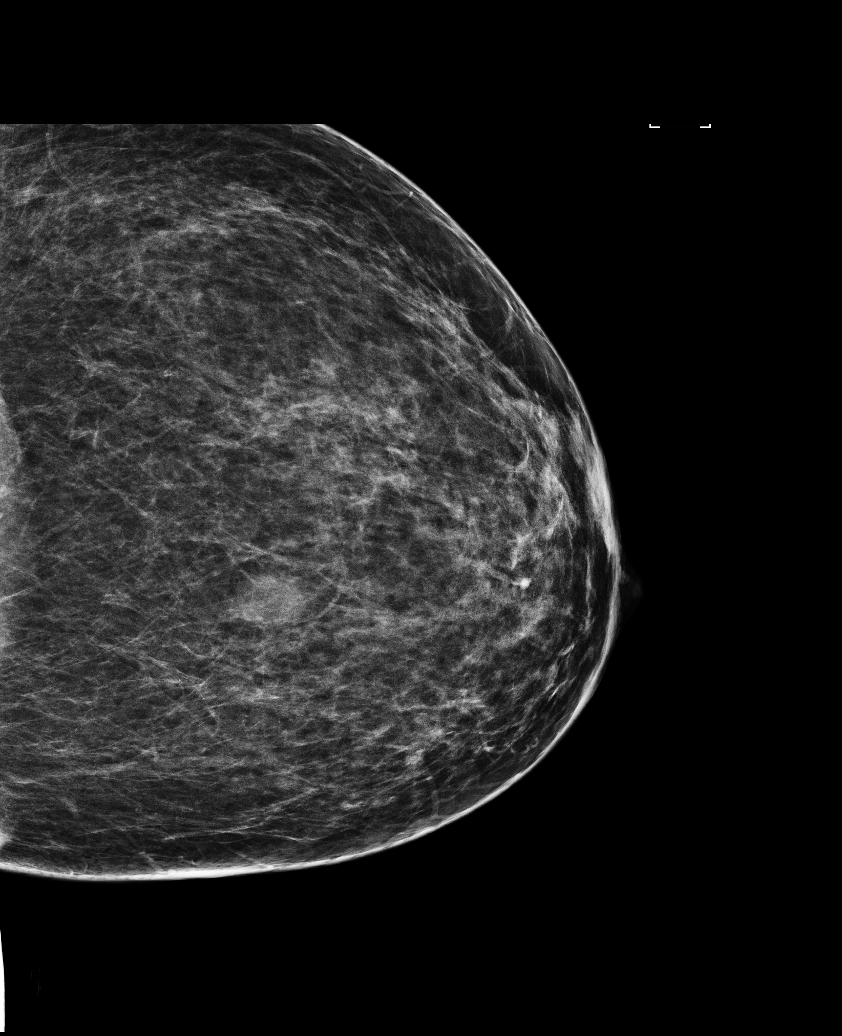

[L MLO]
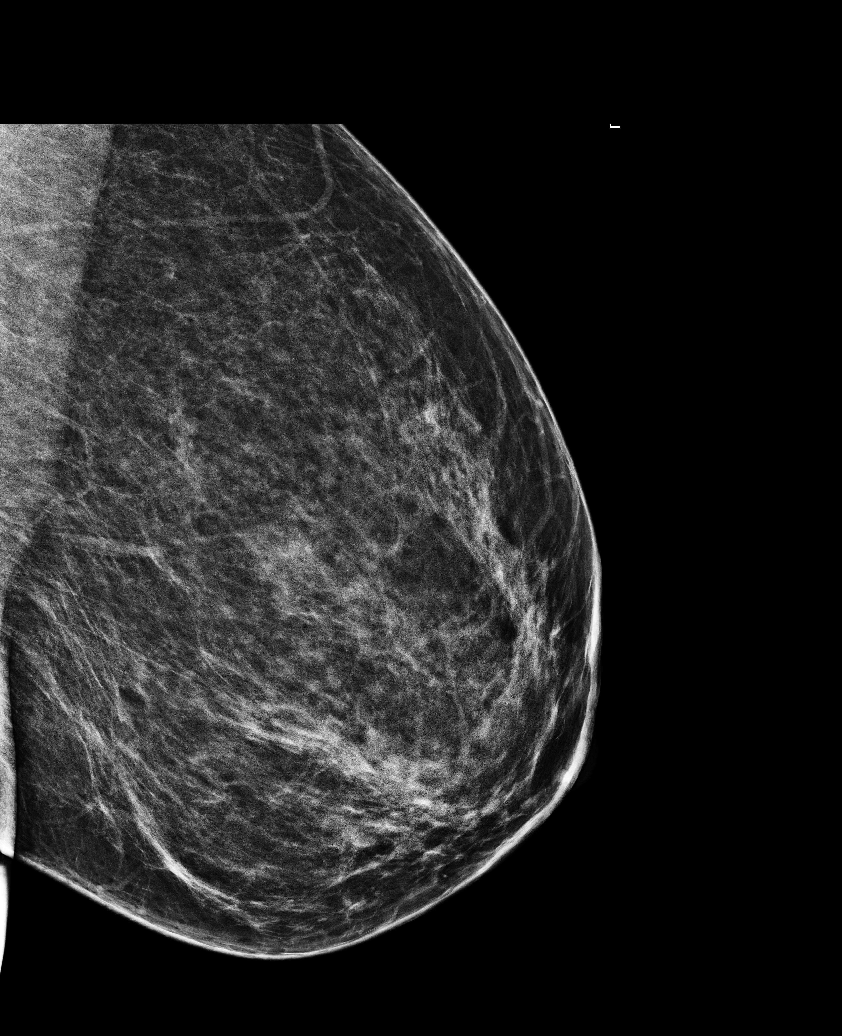

[L CC synth-2D]
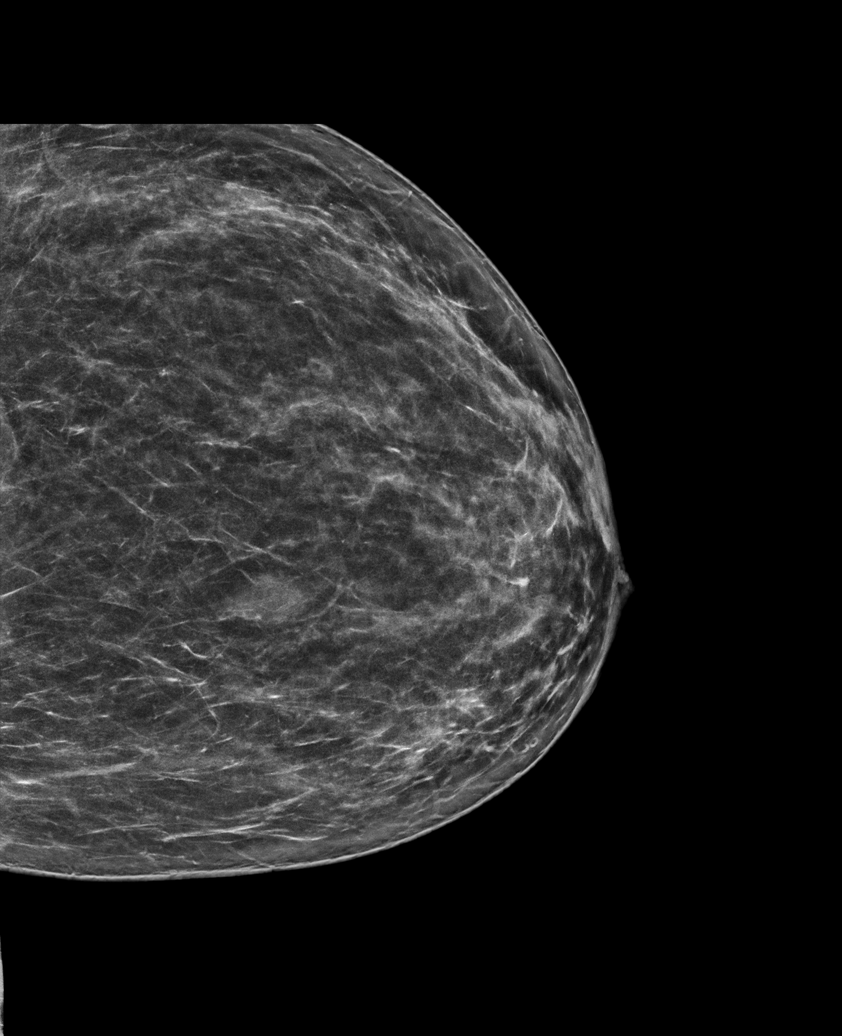

[L CC tomo · tomo slice 37/73.0]
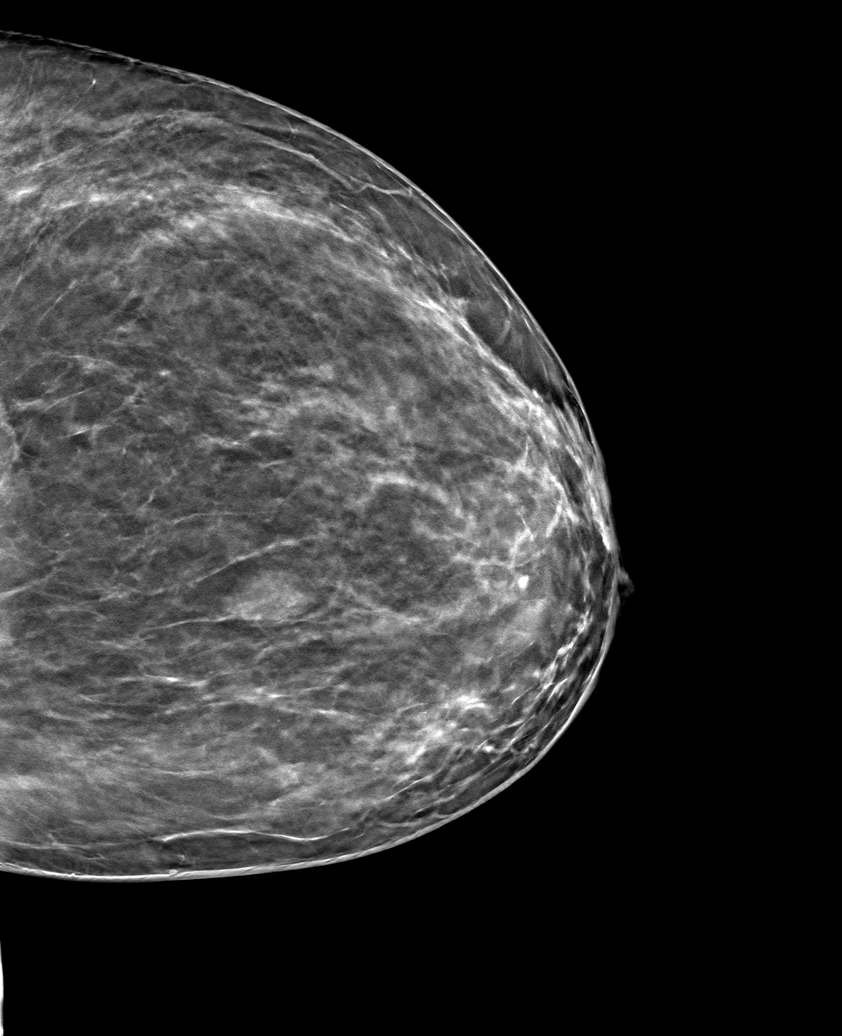

[L MLO tomo · tomo slice 38/75.0]
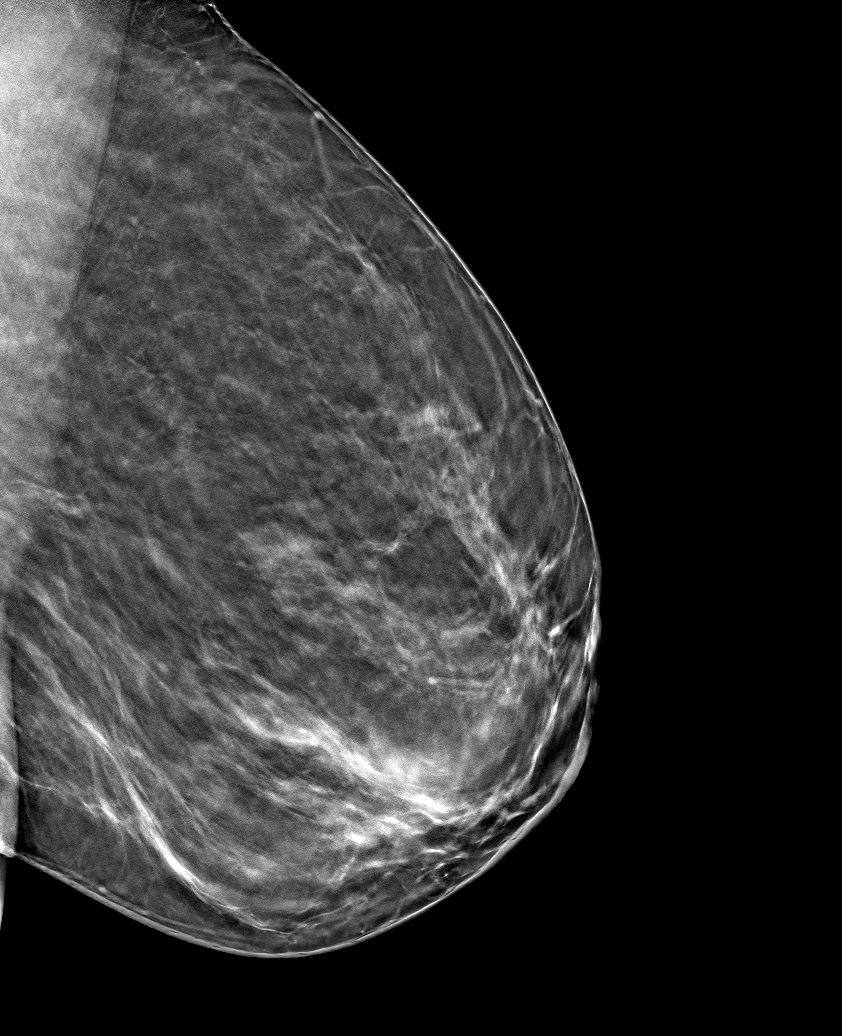

[6 of 14 positions shown; findings below may reference images not displayed]

ACR Breast Density Category b: There are scattered areas of
fibroglandular density.
FINDINGS: In the slightly upper inner quadrant of the left breast, there is a
2.5 cm oval mass with partially circumscribed and partially
indistinct margins.

Mammographic images were processed with CAD.

On physical exam, no discrete palpable masses are identified on exam
of the upper inner quadrant of the left breast.

Targeted ultrasound is performed, showing a hypoechoic oval mass
with indistinct margins at 11 o'clock, 4 cm from the nipple
measuring 1.8 x 0.6 x 1.3 cm. Ultrasound of the left axilla
demonstrates numerous prominent lymph nodes with cortices measuring
up to 4 mm.
IMPRESSION: 1.  There is an indeterminate mass in the left breast at 11 o'clock.

2.  Numerous prominent left axillary lymph nodes.

RECOMMENDATION:
Ultrasound-guided biopsy is recommended for the left breast mass at
11 o'clock and for 1 of the prominent left axillary lymph nodes. The
procedure has been added to today's interventional schedule at [DATE]
p.m..

I have discussed the findings and recommendations with the patient.
Results were also provided in writing at the conclusion of the
visit. If applicable, a reminder letter will be sent to the patient
regarding the next appointment.

BI-RADS CATEGORY  4: Suspicious.

## 2018-03-01 DIAGNOSIS — Z13220 Encounter for screening for lipoid disorders: Secondary | ICD-10-CM | POA: Diagnosis not present

## 2018-03-01 DIAGNOSIS — Z0000 Encounter for general adult medical examination without abnormal findings: Secondary | ICD-10-CM | POA: Diagnosis not present

## 2018-03-02 DIAGNOSIS — Z0000 Encounter for general adult medical examination without abnormal findings: Secondary | ICD-10-CM | POA: Diagnosis not present

## 2018-06-05 ENCOUNTER — Other Ambulatory Visit (HOSPITAL_COMMUNITY)
Admission: RE | Admit: 2018-06-05 | Discharge: 2018-06-05 | Disposition: A | Discharge status: Home / Self Care | Payer: 59 | Source: Ambulatory Visit | Attending: Obstetrics and Gynecology | Admitting: Obstetrics and Gynecology

## 2018-06-05 ENCOUNTER — Other Ambulatory Visit: Payer: Self-pay | Admitting: Obstetrics and Gynecology

## 2018-06-05 DIAGNOSIS — R87810 Cervical high risk human papillomavirus (HPV) DNA test positive: Secondary | ICD-10-CM | POA: Insufficient documentation

## 2018-06-05 DIAGNOSIS — Z01419 Encounter for gynecological examination (general) (routine) without abnormal findings: Secondary | ICD-10-CM | POA: Insufficient documentation

## 2018-06-08 LAB — CYTOLOGY - PAP
HPV (WINDOPATH): DETECTED — AB
HPV 16/18/45 GENOTYPING: NEGATIVE

## 2018-12-15 ENCOUNTER — Other Ambulatory Visit: Payer: Self-pay | Admitting: Family Medicine

## 2018-12-15 DIAGNOSIS — Z1231 Encounter for screening mammogram for malignant neoplasm of breast: Secondary | ICD-10-CM

## 2019-01-31 ENCOUNTER — Ambulatory Visit: Payer: 59

## 2019-02-19 ENCOUNTER — Other Ambulatory Visit (HOSPITAL_COMMUNITY)
Admission: RE | Admit: 2019-02-19 | Discharge: 2019-02-19 | Disposition: A | Discharge status: Home / Self Care | Payer: 59 | Source: Ambulatory Visit | Attending: Obstetrics and Gynecology | Admitting: Obstetrics and Gynecology

## 2019-02-19 ENCOUNTER — Other Ambulatory Visit: Payer: Self-pay | Admitting: Obstetrics and Gynecology

## 2019-02-19 DIAGNOSIS — R87810 Cervical high risk human papillomavirus (HPV) DNA test positive: Secondary | ICD-10-CM | POA: Insufficient documentation

## 2019-02-25 LAB — CYTOLOGY - PAP
Comment: NEGATIVE
Comment: NEGATIVE
HPV 16: NEGATIVE
HPV 18 / 45: NEGATIVE
High risk HPV: POSITIVE — AB

## 2019-03-27 ENCOUNTER — Ambulatory Visit
Admission: RE | Admit: 2019-03-27 | Discharge: 2019-03-27 | Disposition: A | Discharge status: Home / Self Care | Payer: 59 | Source: Ambulatory Visit | Attending: Family Medicine | Admitting: Family Medicine

## 2019-03-27 ENCOUNTER — Other Ambulatory Visit: Payer: Self-pay

## 2019-03-27 DIAGNOSIS — Z1231 Encounter for screening mammogram for malignant neoplasm of breast: Secondary | ICD-10-CM

## 2019-07-25 ENCOUNTER — Other Ambulatory Visit: Payer: Self-pay | Admitting: Obstetrics and Gynecology

## 2019-07-31 ENCOUNTER — Encounter: Payer: Self-pay | Admitting: Registered"

## 2019-07-31 ENCOUNTER — Encounter: Payer: 59 | Attending: Obstetrics and Gynecology | Admitting: Registered"

## 2019-07-31 ENCOUNTER — Other Ambulatory Visit: Payer: Self-pay

## 2019-07-31 DIAGNOSIS — R635 Abnormal weight gain: Secondary | ICD-10-CM | POA: Insufficient documentation

## 2019-07-31 NOTE — Progress Notes (Signed)
  Medical Nutrition Therapy:  Appt start time: 11:15 end time: 12:18.   Assessment:  Primary concerns today: Recent referral has recent A1c of 5.6. Reports this is the highest her A1c has ever been. Also reports history of PCOS. Pt reports borderline A1c is a concern as well as increased weight over time.   Pt expectations: a plan  Pt states she hasn't changed much of anything over time.  Pt states she normally only eats bread with sandwiches, otherwise doesn't prefer to eat it. States she does not like the way carbs make her feel. States she has not felt like eating much, will snack on nuts and dried fruit throughout the day. States she does not feel like eating when weather warms up. Noticed this began during teenage years and has continued. States she usually has wax and wane with appetite; it comes and goes. States she does not eat a lot of sweets but craves salty things. States this is why she snacks on nuts often. States she has been physically active for over a year.   Reports her dad has 2 siblings with diabetes along with paternal grandmother having diabetes-related complications. Denies diabetes prevalence with maternal side of family.    Preferred Learning Style:   No preference indicated   Learning Readiness:   Contemplating  Ready  Change in progress   MEDICATIONS: See list   DIETARY INTAKE:  Usual eating pattern includes 1-3 meals and 1-2 snacks per day.  Everyday foods include dried fruit, nuts, greek yogurt, fruit, granola, and salads.  Avoided foods include beef, pork, eggs, rice, pasta, potatoes (unless fries), milk, chocolate, peanut butter.    24-hr recall:  B ( AM): greek yogurt + granola + fruit or premier protein shake + avocado + sugar-free fruit flavoring  Snk ( AM): nuts + dried fruit  L ( PM): frozen chicken burrito (rice, cheese) or greek salad (vegetables, feta cheese, chicken)  Snk ( PM):  D ( PM): sometimes skips or chicken sandwich (swiss,  peppers, onions, mushrooms) + a few baked french fries  Snk ( PM): orange Beverages: water (48 oz), sparkling water (sometimes)  Usual physical activity: walking 30 min, 4-5 days/week  Estimated energy needs: 1800-2000 calories 200-225 g carbohydrates 135-150 g protein 50-56 g fat  Progress Towards Goal(s):  In progress.   Nutritional Diagnosis:  NB-1.1 Food and nutrition-related knowledge deficit As related to elevated A1c.  As evidenced by recent A1c lab value 5.6.    Intervention:  Nutrition education and counseling. Pt was educated and counseled on metabolism, importance of not skipping meals, effects of dieting/restriction, how carbohydrates work in the body, A1c ranges for prediabetes/diabetes, role of fiber in eating regimen, and importance of physical activity. Discussed meal/snack planning and how to balance meals/snacks using MyPlate for prediabetes. Pt was in agreement with goals listed. Goals: - Aim to consistently have 3 meals a day.  - Aim to have 1/2 plate as non-starchy vegetables, 1/4 plate of protein, and 1/4 plate of starch/grain/carbohydrates.   Teaching Method Utilized:  Visual Auditory Hands on  Handouts given during visit include:  none  Barriers to learning/adherence to lifestyle change: none identified  Demonstrated degree of understanding via:  Teach Back   Monitoring/Evaluation:  Dietary intake, exercise, and body weight prn.

## 2019-07-31 NOTE — Patient Instructions (Signed)
-   Aim to consistently have 3 meals a day.   - Aim to have 1/2 plate as non-starchy vegetables, 1/4 plate of protein, and 1/4 plate of starch/grain/carbohydrates.

## 2019-09-11 ENCOUNTER — Encounter (HOSPITAL_BASED_OUTPATIENT_CLINIC_OR_DEPARTMENT_OTHER): Payer: Self-pay | Admitting: Obstetrics and Gynecology

## 2019-09-11 ENCOUNTER — Other Ambulatory Visit: Payer: Self-pay

## 2019-09-15 ENCOUNTER — Other Ambulatory Visit (HOSPITAL_COMMUNITY)
Admission: RE | Admit: 2019-09-15 | Discharge: 2019-09-15 | Disposition: A | Discharge status: Home / Self Care | Payer: 59 | Source: Ambulatory Visit | Attending: Obstetrics and Gynecology | Admitting: Obstetrics and Gynecology

## 2019-09-15 DIAGNOSIS — Z01812 Encounter for preprocedural laboratory examination: Secondary | ICD-10-CM | POA: Diagnosis not present

## 2019-09-15 DIAGNOSIS — Z20822 Contact with and (suspected) exposure to covid-19: Secondary | ICD-10-CM | POA: Diagnosis not present

## 2019-09-15 LAB — SARS CORONAVIRUS 2 (TAT 6-24 HRS): SARS Coronavirus 2: NEGATIVE

## 2019-09-18 NOTE — Anesthesia Preprocedure Evaluation (Addendum)
Anesthesia Evaluation  Patient identified by MRN, date of birth, ID band Patient awake    Reviewed: Allergy & Precautions, NPO status , Patient's Chart, lab work & pertinent test results  Airway Mallampati: II  TM Distance: >3 FB Neck ROM: Full    Dental no notable dental hx. (+) Teeth Intact, Dental Advisory Given   Pulmonary neg pulmonary ROS,    Pulmonary exam normal breath sounds clear to auscultation       Cardiovascular Exercise Tolerance: Good negative cardio ROS Normal cardiovascular exam Rhythm:Regular Rate:Normal     Neuro/Psych negative neurological ROS  negative psych ROS   GI/Hepatic negative GI ROS, Neg liver ROS,   Endo/Other  negative endocrine ROS  Renal/GU negative Renal ROS     Musculoskeletal negative musculoskeletal ROS (+)   Abdominal   Peds  Hematology  (+) anemia ,   Anesthesia Other Findings   Reproductive/Obstetrics                           Anesthesia Physical Anesthesia Plan  ASA: II  Anesthesia Plan: General   Post-op Pain Management:    Induction: Intravenous  PONV Risk Score and Plan: 4 or greater and Treatment may vary due to age or medical condition, Ondansetron, Dexamethasone and Midazolam  Airway Management Planned: LMA  Additional Equipment: None  Intra-op Plan:   Post-operative Plan: Extubation in OR  Informed Consent: I have reviewed the patients History and Physical, chart, labs and discussed the procedure including the risks, benefits and alternatives for the proposed anesthesia with the patient or authorized representative who has indicated his/her understanding and acceptance.     Dental advisory given  Plan Discussed with: CRNA  Anesthesia Plan Comments:        Anesthesia Quick Evaluation

## 2019-09-19 ENCOUNTER — Ambulatory Visit (HOSPITAL_BASED_OUTPATIENT_CLINIC_OR_DEPARTMENT_OTHER): Payer: 59 | Admitting: Anesthesiology

## 2019-09-19 ENCOUNTER — Ambulatory Visit (HOSPITAL_BASED_OUTPATIENT_CLINIC_OR_DEPARTMENT_OTHER)
Admission: RE | Admit: 2019-09-19 | Discharge: 2019-09-19 | Disposition: A | Discharge status: Home / Self Care | Payer: 59 | Attending: Obstetrics and Gynecology | Admitting: Obstetrics and Gynecology

## 2019-09-19 ENCOUNTER — Encounter (HOSPITAL_BASED_OUTPATIENT_CLINIC_OR_DEPARTMENT_OTHER): Payer: Self-pay | Admitting: Obstetrics and Gynecology

## 2019-09-19 ENCOUNTER — Encounter (HOSPITAL_BASED_OUTPATIENT_CLINIC_OR_DEPARTMENT_OTHER)
Admission: RE | Disposition: A | Discharge status: Home / Self Care | Payer: Self-pay | Source: Home / Self Care | Attending: Obstetrics and Gynecology

## 2019-09-19 ENCOUNTER — Other Ambulatory Visit: Payer: Self-pay

## 2019-09-19 DIAGNOSIS — Z8249 Family history of ischemic heart disease and other diseases of the circulatory system: Secondary | ICD-10-CM | POA: Diagnosis not present

## 2019-09-19 DIAGNOSIS — Z8042 Family history of malignant neoplasm of prostate: Secondary | ICD-10-CM | POA: Insufficient documentation

## 2019-09-19 DIAGNOSIS — N879 Dysplasia of cervix uteri, unspecified: Secondary | ICD-10-CM | POA: Diagnosis present

## 2019-09-19 DIAGNOSIS — Z800 Family history of malignant neoplasm of digestive organs: Secondary | ICD-10-CM | POA: Insufficient documentation

## 2019-09-19 DIAGNOSIS — G43909 Migraine, unspecified, not intractable, without status migrainosus: Secondary | ICD-10-CM | POA: Insufficient documentation

## 2019-09-19 DIAGNOSIS — D573 Sickle-cell trait: Secondary | ICD-10-CM | POA: Diagnosis not present

## 2019-09-19 DIAGNOSIS — L658 Other specified nonscarring hair loss: Secondary | ICD-10-CM | POA: Insufficient documentation

## 2019-09-19 DIAGNOSIS — N870 Mild cervical dysplasia: Secondary | ICD-10-CM | POA: Insufficient documentation

## 2019-09-19 DIAGNOSIS — Z8349 Family history of other endocrine, nutritional and metabolic diseases: Secondary | ICD-10-CM | POA: Insufficient documentation

## 2019-09-19 DIAGNOSIS — Z832 Family history of diseases of the blood and blood-forming organs and certain disorders involving the immune mechanism: Secondary | ICD-10-CM | POA: Insufficient documentation

## 2019-09-19 HISTORY — PX: CERVICAL CONIZATION W/BX: SHX1330

## 2019-09-19 HISTORY — DX: Anemia, unspecified: D64.9

## 2019-09-19 LAB — POCT PREGNANCY, URINE: Preg Test, Ur: NEGATIVE

## 2019-09-19 SURGERY — CONE BIOPSY, CERVIX
Anesthesia: General | Site: Vagina

## 2019-09-19 MED ORDER — ONDANSETRON HCL 4 MG/2ML IJ SOLN
INTRAMUSCULAR | Status: DC | PRN
  Administered 2019-09-19: 4 mg via INTRAVENOUS

## 2019-09-19 MED ORDER — ONDANSETRON HCL 4 MG/2ML IJ SOLN
INTRAMUSCULAR | Status: AC
  Filled 2019-09-19: qty 2

## 2019-09-19 MED ORDER — BUPIVACAINE HCL (PF) 0.5 % IJ SOLN
INTRAMUSCULAR | Status: AC
  Filled 2019-09-19: qty 30

## 2019-09-19 MED ORDER — FERRIC SUBSULFATE 259 MG/GM EX SOLN
CUTANEOUS | Status: AC
  Filled 2019-09-19: qty 8

## 2019-09-19 MED ORDER — MIDAZOLAM HCL 5 MG/5ML IJ SOLN
INTRAMUSCULAR | Status: DC | PRN
  Administered 2019-09-19: 2 mg via INTRAVENOUS

## 2019-09-19 MED ORDER — EPHEDRINE 5 MG/ML INJ
INTRAVENOUS | Status: AC
  Filled 2019-09-19: qty 10

## 2019-09-19 MED ORDER — FENTANYL CITRATE (PF) 100 MCG/2ML IJ SOLN
INTRAMUSCULAR | Status: AC
  Filled 2019-09-19: qty 2

## 2019-09-19 MED ORDER — LIDOCAINE-EPINEPHRINE 1 %-1:100000 IJ SOLN
INTRAMUSCULAR | Status: AC
  Filled 2019-09-19: qty 1

## 2019-09-19 MED ORDER — FERRIC SUBSULFATE SOLN
Status: DC | PRN
  Administered 2019-09-19: 1 via TOPICAL

## 2019-09-19 MED ORDER — SCOPOLAMINE 1 MG/3DAYS TD PT72
1.0000 | MEDICATED_PATCH | TRANSDERMAL | Status: DC
  Administered 2019-09-19: 1.5 mg via TRANSDERMAL

## 2019-09-19 MED ORDER — EPHEDRINE SULFATE-NACL 50-0.9 MG/10ML-% IV SOSY: PREFILLED_SYRINGE | INTRAVENOUS | Status: DC | PRN

## 2019-09-19 MED ORDER — LIDOCAINE-EPINEPHRINE 1 %-1:100000 IJ SOLN
INTRAMUSCULAR | Status: DC | PRN
  Administered 2019-09-19: 10 mL

## 2019-09-19 MED ORDER — SCOPOLAMINE 1 MG/3DAYS TD PT72
MEDICATED_PATCH | TRANSDERMAL | Status: AC
  Filled 2019-09-19: qty 1

## 2019-09-19 MED ORDER — LACTATED RINGERS IV SOLN: INTRAVENOUS | Status: DC

## 2019-09-19 MED ORDER — PROPOFOL 10 MG/ML IV BOLUS
INTRAVENOUS | Status: DC | PRN
  Administered 2019-09-19: 150 mg via INTRAVENOUS

## 2019-09-19 MED ORDER — IODINE STRONG (LUGOLS) 5 % PO SOLN
ORAL | Status: AC
  Filled 2019-09-19: qty 1

## 2019-09-19 MED ORDER — LIDOCAINE 2% (20 MG/ML) 5 ML SYRINGE
INTRAMUSCULAR | Status: DC | PRN
  Administered 2019-09-19: 100 mg via INTRAVENOUS

## 2019-09-19 MED ORDER — FENTANYL CITRATE (PF) 250 MCG/5ML IJ SOLN
INTRAMUSCULAR | Status: DC | PRN
  Administered 2019-09-19: 50 ug via INTRAVENOUS
  Administered 2019-09-19 (×2): 25 ug via INTRAVENOUS

## 2019-09-19 MED ORDER — IODINE STRONG (LUGOLS) 5 % PO SOLN
ORAL | Status: DC | PRN
  Administered 2019-09-19: 0.1 mL

## 2019-09-19 MED ORDER — POVIDONE-IODINE 10 % EX SWAB
2.0000 "application " | Freq: Once | CUTANEOUS | Status: AC
  Administered 2019-09-19: 2 via TOPICAL

## 2019-09-19 MED ORDER — LIDOCAINE HCL (PF) 1 % IJ SOLN
INTRAMUSCULAR | Status: AC
  Filled 2019-09-19: qty 30

## 2019-09-19 MED ORDER — MIDAZOLAM HCL 2 MG/2ML IJ SOLN
INTRAMUSCULAR | Status: AC
  Filled 2019-09-19: qty 2

## 2019-09-19 MED ORDER — IBUPROFEN 600 MG PO TABS: 600.0000 mg | ORAL_TABLET | Freq: Four times a day (QID) | ORAL | 0 refills | Status: DC | PRN

## 2019-09-19 MED ORDER — PROPOFOL 10 MG/ML IV BOLUS
INTRAVENOUS | Status: AC
  Filled 2019-09-19: qty 20

## 2019-09-19 MED ORDER — DEXAMETHASONE SODIUM PHOSPHATE 10 MG/ML IJ SOLN
INTRAMUSCULAR | Status: DC | PRN
  Administered 2019-09-19: 5 mg via INTRAVENOUS

## 2019-09-19 SURGICAL SUPPLY — 31 items
APPLICATOR ARISTA FLEXITIP XL (MISCELLANEOUS) IMPLANT
BLADE SURG 11 STRL SS (BLADE) ×4 IMPLANT
CATH ROBINSON RED A/P 16FR (CATHETERS) ×2 IMPLANT
ELECT BALL LEEP 5MM RED (ELECTRODE) ×2 IMPLANT
ELECT REM PT RETURN 9FT ADLT (ELECTROSURGICAL) ×2
ELECTRODE REM PT RTRN 9FT ADLT (ELECTROSURGICAL) ×1 IMPLANT
GLOVE BIO SURGEON STRL SZ 6.5 (GLOVE) ×2 IMPLANT
GLOVE BIO SURGEON STRL SZ7 (GLOVE) ×2 IMPLANT
GLOVE BIOGEL PI IND STRL 6.5 (GLOVE) ×1 IMPLANT
GLOVE BIOGEL PI IND STRL 7.0 (GLOVE) ×4 IMPLANT
GLOVE BIOGEL PI INDICATOR 6.5 (GLOVE) ×1
GLOVE BIOGEL PI INDICATOR 7.0 (GLOVE) ×4
GLOVE SURG SS PI 7.0 STRL IVOR (GLOVE) ×2 IMPLANT
GOWN STRL REUS W/ TWL LRG LVL3 (GOWN DISPOSABLE) ×1 IMPLANT
GOWN STRL REUS W/TWL LRG LVL3 (GOWN DISPOSABLE) ×6 IMPLANT
HEMOSTAT ARISTA ABSORB 3G PWDR (HEMOSTASIS) IMPLANT
PACK VAGINAL MINOR WOMEN LF (CUSTOM PROCEDURE TRAY) ×2 IMPLANT
PAD OB MATERNITY 4.3X12.25 (PERSONAL CARE ITEMS) ×2 IMPLANT
PAD PREP 24X48 CUFFED NSTRL (MISCELLANEOUS) ×2 IMPLANT
PENCIL SMOKE EVACUATOR (MISCELLANEOUS) ×2 IMPLANT
SCOPETTES 8  STERILE (MISCELLANEOUS) ×2
SCOPETTES 8 STERILE (MISCELLANEOUS) ×1 IMPLANT
SLEEVE SCD COMPRESS KNEE MED (MISCELLANEOUS) ×2 IMPLANT
SPONGE SURGIFOAM ABS GEL 12-7 (HEMOSTASIS) ×2 IMPLANT
SUT VIC AB 0 CT1 27 (SUTURE) ×2
SUT VIC AB 0 CT1 27XBRD ANBCTR (SUTURE) ×1 IMPLANT
SUT VIC AB 3-0 CT1 27 (SUTURE) ×4
SUT VIC AB 3-0 CT1 TAPERPNT 27 (SUTURE) ×2 IMPLANT
TOWEL GREEN STERILE FF (TOWEL DISPOSABLE) ×2 IMPLANT
TUBE CONNECTING 20X1/4 (TUBING) ×4 IMPLANT
YANKAUER SUCT BULB TIP NO VENT (SUCTIONS) ×2 IMPLANT

## 2019-09-19 NOTE — Brief Op Note (Signed)
09/19/2019  3:32 PM  PATIENT:  Leah Robertson  44 y.o. 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  PROCEDURE:  Procedure(s): CONIZATION CERVIX WITH BIOPSY (N/A)  SURGEON:  Surgeon(s) and Role:    Thurnell Lose, MD - Primary  PHYSICIAN ASSISTANT:   ASSISTANTS: Technician   ANESTHESIA:   local and general  EBL:  50 mL   BLOOD ADMINISTERED:none  DRAINS: none   LOCAL MEDICATIONS USED:  Lidocaine with epinephrine  SPECIMEN:  Source of Specimen:  Cone biopsy of cervix  DISPOSITION OF SPECIMEN:  PATHOLOGY  COUNTS:  YES  TOURNIQUET:  * No tourniquets in log *  DICTATION: .Other Dictation: Dictation Number [id number]  PLAN OF CARE: Discharge to home after PACU  PATIENT DISPOSITION:  PACU - hemodynamically stable.   Delay start of Pharmacological VTE agent (>24hrs) due to surgical blood loss or risk of bleeding: not applicable

## 2019-09-19 NOTE — Discharge Instructions (Addendum)
Cervical Conization Cervical conization (cone biopsy) is a procedure in which a cone-shaped portion of the cervix is cut out so that it can be looked at under a microscope. The cervix is the lowest part of the uterus. This procedure is done to check for cancer cells or cells that might turn into cancer (precancerous cells). You may have this procedure if:  You have abnormal bleeding from your cervix.  You had an abnormal Pap test.  Something abnormal was seen on your cervix during an exam. This procedure is performed in either a health care provider's office or in an operating room. Tell a health care provider about:  Any allergies you have.  All medicines you are taking, including vitamins, herbs, eye drops, creams, and over-the-counter medicines.  Any problems you or family members have had with anesthetic medicines.  Any blood disorders you have.  Any surgeries you have had.  Any medical conditions you have.  Your use of nicotine or tobacco products.  When you normally have your menstrual period.  Whether you are pregnant or may be pregnant. What are the risks? Generally, this is a safe procedure. However, problems may occur, including:  Heavy bleeding for several days or weeks after the procedure.  Allergic reactions to medicines or to dyes or other solutions.  Increased risk of early (preterm) labor in future pregnancies.  Infection. This is rare.  Damage to the cervix or nearby structures or organs. This is rare. What happens before the procedure? Staying hydrated Follow instructions from your health care provider about hydration, which may include:  Up to 2 hours before the procedure - you may continue to drink clear liquids, such as water, clear fruit juice, black coffee, and plain tea. Eating and drinking restrictions Follow instructions from your health care provider about eating and drinking, which may include:  8 hours before the procedure - stop eating  heavy meals or foods, such as meat, fried foods, or fatty foods.  6 hours before the procedure - stop eating light meals or foods, such as toast or cereal.  6 hours before the procedure - stop drinking milk or drinks that contain milk.  2 hours before the procedure - stop drinking clear liquids. Medicines Ask your health care provider about:  Changing or stopping your regular medicines. This is especially important if you are taking diabetes medicines or blood thinners.  Taking medicines such as aspirin and ibuprofen. These medicines can thin your blood. Do not take these medicines unless your health care provider tells you to take them.  Taking over-the-counter medicines, vitamins, herbs, and supplements. General instructions  If told by your health care provider, do not put anything in the vagina before the procedure. Do not douche, have sex, use tampons, or use any vaginal medicines before the procedure.  Ask your health care provider: ? How your surgery site will be marked. ? What steps will be taken to help prevent infection. These may include:  Removing hair at the surgery site.  Washing skin with a germ-killing soap.  Taking antibiotic medicine.  Plan to have someone take you home from the hospital or clinic.  If you will be going home right after the procedure, plan to have someone with you for 24 hours.  You may be asked to empty your bladder and bowel right before the procedure. What happens during the procedure?      You will lie on an examining table and put your feet in footrests (stirrups).  An IV  will be inserted into one of your veins.  You will be given one or more of the following: ? A medicine to help you relax (sedative). ? A medicine to numb the area (local anesthetic). ? A medicine to make you fall asleep (general anesthetic). ? A medicine that numbs the cervix (cervical block).  A lubricated device called a speculum will be inserted into your  vagina. It will be used to spread open the walls of the vagina so your health care provider can better see the inside of the vagina and cervix.  An instrument that has a magnifying lens and a light (colposcope) will let your health care provider examine the cervix more closely.  Your health care provider will apply a solution to your cervix. This turns abnormal areas a pale color.  A tissue sample will be removed from the cervix using one of the following methods: ? The cold knife method. The tissue is cut out with a knife (scalpel). ? The loop electrosurgical excision procedure (LEEP) method. The tissue is cut out with a thin wire that can burn (cauterize) the tissue with an electrical current. ? Laser treatment method. The tissue is cut out and then cauterized with a laser beam to prevent bleeding.  Your health care provider will apply a paste over the biopsy areas to help control bleeding.  The tissue sample will be checked under a microscope. The procedure may vary among health care providers and hospitals. What happens after the procedure?  Your blood pressure, heart rate, breathing rate, and blood oxygen level will be monitored until you leave the hospital or clinic.  If you were given a local anesthetic, you will rest at the clinic or hospital until you are stable and ready to go home.  If you were given a general anesthetic, you may be monitored for a longer period of time.  You may have some cramping.  You may have bloody discharge or light to moderate bleeding.  You may have dark discharge coming from your vagina. This is from the paste used on the cervix to prevent bleeding. Summary  Cervical conization is a procedure in which a cone-shaped portion of the cervix is cut out so that it can be examined under a microscope.  This procedure is done to check for cancer cells or cells that might turn into cancer (precancerous cells).  After the procedure, you may have bloody  discharge or light to moderate bleeding. You may also have dark discharge from the paste that was used on the cervix to prevent bleeding. This information is not intended to replace advice given to you by your health care provider. Make sure you discuss any questions you have with your health care provider. Document Revised: 09/04/2018 Document Reviewed: 09/04/2018 Elsevier Patient Education  [place] Instructions  Activity: Get plenty of rest for the remainder of the day. A responsible individual must stay with you for 24 hours following the procedure.  For the next 24 hours, DO NOT: -Drive a car -Paediatric nurse -Drink alcoholic beverages -Take any medication unless instructed by your physician -Make any legal decisions or sign important papers.  Meals: Start with liquid foods such as gelatin or soup. Progress to regular foods as tolerated. Avoid greasy, spicy, heavy foods. If nausea and/or vomiting occur, drink only clear liquids until the nausea and/or vomiting subsides. Call your physician if vomiting continues.  Special Instructions/Symptoms: Your throat may feel dry or sore  from the anesthesia or the breathing tube placed in your throat during surgery. If this causes discomfort, gargle with warm salt water. The discomfort should disappear within 24 hours.  If you had a scopolamine patch placed behind your ear for the management of post- operative nausea and/or vomiting:  1. The medication in the patch is effective for 72 hours, after which it should be removed.  Wrap patch in a tissue and discard in the trash. Wash hands thoroughly with soap and water. 2. You may remove the patch earlier than 72 hours if you experience unpleasant side effects which may include dry mouth, dizziness or visual disturbances. 3. Avoid touching the patch. Wash your hands with soap and water after contact with the patch.

## 2019-09-19 NOTE — Interval H&P Note (Signed)
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  The various methods of treatment have been discussed with the patient and family. After consideration of risks, benefits and other options for treatment, the patient has consented to  Procedure(s): CONIZATION CERVIX WITH BIOPSY (N/A) as a surgical intervention.  The patient's history has been reviewed, patient examined, no change in status, stable for surgery.  I have reviewed the patient's chart and labs.  Questions were answered to the patient's satisfaction.     Thurnell Lose

## 2019-09-19 NOTE — Anesthesia Procedure Notes (Signed)
Procedure Name: LMA Insertion Date/Time: 09/19/2019 2:32 PM Performed by: Maryella Shivers, CRNA Pre-anesthesia Checklist: Patient identified, Emergency Drugs available, Suction available and Patient being monitored Patient Re-evaluated:Patient Re-evaluated prior to induction Oxygen Delivery Method: Circle system utilized Preoxygenation: Pre-oxygenation with 100% oxygen Induction Type: IV induction Ventilation: Mask ventilation without difficulty LMA: LMA inserted LMA Size: 4.0 Number of attempts: 1 Airway Equipment and Method: Bite block Placement Confirmation: positive ETCO2 Tube secured with: Tape Dental Injury: Teeth and Oropharynx as per pre-operative assessment

## 2019-09-19 NOTE — Transfer of Care (Signed)
Immediate Anesthesia Transfer of Care Note  Patient: Leah Robertson  Procedure(s) Performed: CONIZATION CERVIX WITH BIOPSY (N/A Vagina )  Patient Location: PACU  Anesthesia Type:General  Level of Consciousness: awake, alert , oriented and drowsy  Airway & Oxygen Therapy: Patient Spontanous Breathing and Patient connected to nasal cannula oxygen  Post-op Assessment: Report given to RN and Post -op Vital signs reviewed and stable  Post vital signs: Reviewed and stable  Last Vitals:  Vitals Value Taken Time  BP    Temp    Pulse    Resp    SpO2      Last Pain:  Vitals:   09/19/19 1219  TempSrc: Oral  PainSc: 0-No pain         Complications: No complications documented.

## 2019-09-19 NOTE — H&P (Signed)
History of Present Illness  General:          44 y/o female presents today for preoperative examination prior to Cervical Biopsy due to suspicious lesion noted in cervix.        Pap smear on 02/26/2019 was LGSIL/HPV+. Isolation Precautions:          Respiratory Illness Screening             1. Is fever present / reported?  No           2. Are respiratory illness symptom(s) present / reported?  No           3. Are other symptom(s) present / reported?  No           5. Has there been reported travel to a High Risk respiratory illness region?  Unknown           6. Has close* contact with person(s) known to have communicable illness been reported?  No        Has patient been tested for COVID-19? No.         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      Current Medications  Taking  .Integra 62.5-62.5-40-3 MG Capsule 1 capsule between meals Orally Once a day    .YAZ(Drospirenone-Ethinyl Estradiol) 3-0.02 MG Tablet 1 tablet Orally Once a day    .Clobetasol Propionate 0.05 % Ointment 1 application to scalp every other day External as directed, Notes: prn    .Fluocinolone Acetonide Scalp 4.26 % Oil 1 application to scalp External as needed, Notes: prn    .Multivitamins Tablet 1 tablet Orally once a day    Medication List reviewed and reconciled with the patient       Past Medical History        Fibroids (1) calcified (4.2 x 3.3 x 3.3).         PCOS, [facility].         DERM: [facility].         Migraine headaches, no aura.         Scarring alopecia.         sickle cell trait (per OB history).         Anemia.         HSV-genital .         hx of HPV.         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         Left Breast Bx- fibroadenoam, negative Left lymph node. 3/18.         PCOS - Dr. Chalmers Cater.         Internal hemorrhoids:a single solitary ulcer in the terminal ileum-repeat colon in 5 years, 1/21.              Surgical  History        c-section 2012        Oral surgery Oct 2012        Oral surgery- implant 03/05/2013        oral surgery-Implant removed 09/2017        colonoscopy 2021       Family History   Father: alive 69 yrs, high blood pressure, history prostate cancer, triple by-pass, diagnosed with Hypertension, Prostate CA   Mother: alive 38 yrs, thyroid disease,, TIA 2015:colon cancer at age 9, diagnosed with Colon cancer   Paternal Grand Father: sickle cell trait   Brother 1: alive 11 yrs, high blood  pressure,, diagnosed with Hypertension   Sister 1: alive 69 yrs, A + W   Son(s): alive   1 brother(s) , 1 sister(s) - healthy. 1 son(s) - healthy.    denies any GYN family cancer hx No family history of colon polyps or liver disease.       Social History  General:          Tobacco use               cigarettes:  Never smoked             Tobacco history last updated  09/07/2019             Vaping  No        no EXPOSURE TO PASSIVE SMOKE.         no Alcohol.         no Caffeine.         no Recreational drug use, never.         Exercise: yes, walking- 30 mins 5 times per week.         Marital Status: married, Married.         Children: 2 Boys.         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         no Travel outside [place].         Seat belt use: yes.         no Tobacco Exposure.      Gyn History  Sexual activity currently sexually active.   Periods : every month.   LMP 08/15/2019.   Birth control vasectomy, 1 year ago, ocp.   Last pap smear date 06/11/2019-ASCUS-H/HPV+.   Last mammogram date 03/27/2019.   Abnormal pap smear ASCUS.   STD Herpes.       OB History  Number of pregnancies  2.   Pregnancy # 1  vaginal delivery, live birth.   Pregnancy # 2  C-section.       Allergies   N.K.D.A.       Hospitalization/Major Diagnostic Procedure   childbirth x 1 SVD 03/2002   child birth c-section 08/16/2010   Not in the past year 03/2019       Review  of Systems  See scanned ROS form for details.     Vital Signs  Wt 176.7, Wt change .9 lb, Ht 65.25, BMI 29.18, Temp 98.6, Pulse sitting 86, BP sitting 100/72.     Physical Examination  GENERAL:          Patient appears  in NAD, pleasant.          Build:  well developed.          General Appearance:  well-appearing.   LUNGS:          Breath sounds:  clear to auscultation, bilaterally.   HEART:          Heart sounds:  RRR, no murmurs, gallops or rubs.   ABDOMEN:          General:  no masses,tenderness,organomegaly, , BS normal, non distended.   FEMALE GENITOURINARY:          Adnexa:  no mass, non tender.          Anus/perineum:  normal, no lesions.          Cervix/ cuff:  normal appearance , no lesions/discharge/bleeding, good pelvic support .          External genitalia:  normal,  no lesions, no skin discoloration.          Rectum:  deferred.          Urethra:  normal external meatus.          Uterus:  normal size/shape/consistency, freely mobile, non tender.          Vagina:  pink/moist mucosa, no lesions, no abnormal discharge, odorless.          Vulva:  normal, no lesions, no skin discoloration.   NEUROLOGICAL:          gross motor and sensory  grossly intact.          Orientation:  alert and oriented x 3.        Pt aware of scribe services today.     Assessments     1. Encounter for other preprocedural examination - Z01.818 (Primary)        Treatment   1. Encounter for other preprocedural examination   Notes: Pt has been told that cervical biopsy will make having children in the future difficult. The pt's husband has had a vasectomy. Risk to uterine infection and bleeding discussed with pt. We have informed the pt that any complications during surgery should be resolved during the surgery. We have informed the pt that the biopsy is mostly diagnostic, but we aim to remove the suspicous lesion in her cervix in its entirety. The pt has been told to avoid sexual activity  for 2-4 weeks following the surgery, and she has been told to avoid any physically strenuous activity for 2 weeks following the surgery as well.        Follow Up  2 weeks post surgery

## 2019-09-19 NOTE — Op Note (Signed)
NAME: Leah Robertson, Leah Robertson MEDICAL RECORD [id number] ACCOUNT [id number] DATE OF BIRTH:1976-02-16 FACILITY: [facility] LOCATION: [place], [place]  OPERATIVE REPORT  DATE OF PROCEDURE:  09/19/2019  PREOPERATIVE DIAGNOSIS:  Cervical dysplasia.  POSTOPERATIVE DIAGNOSIS:  Cervical dysplasia.  PROCEDURE:  Cold knife cone biopsy of the cervix.  SURGEON:  Thurnell Lose, MD  ASSISTANT:  Technician.  ANESTHESIA:  Local and general.  ESTIMATED BLOOD LOSS:  50 mL.  BLOOD ADMINISTERED:  None.  DRAINS:  None.  LOCAL:  Lidocaine with epinephrine.  SPECIMENS:  Cone biopsy of cervix.  DISPOSITION OF SPECIMEN:  To pathology.  DISPOSITION:  To PACU, hemodynamically stable.  COMPLICATIONS:  None.  FINDINGS:  Diffuse normal Lugol's uptake of the ectocervix.  Sounding length of the cervix was approximately 4-5 cm.  Conization performed up to the internal os.  No obvious abnormalities  at the base of the cone bed.  DESCRIPTION OF PROCEDURE:  The patient was identified in the holding area.  She was taken to the operating room with IV running.  She was placed in the dorsal supine position, underwent general anesthesia without complication.  She was placed in the  dorsal lithotomy position and prepped and draped in normal sterile fashion.  She was prepped with chlorhexidine.  A timeout was performed.  SCDs were on her legs and operating.  There were no antibiotics given prior to the procedure.  A Graves speculum was used to visualize the cervix.  Lugol's was applied and the entire cervix was stained equally.  As previously determined in the office, the suspicion is not that there are abnormal cells of the ectocervix, that is in the endocervical  canal.  I then sounded the cervix up into the internal os and it felt about 4-5 cm.  I then started the procedure with a straight scalpel.  I made a very small incision.  I then put stay sutures in at 3 and 9 o'clock.  I put in  some epinephrine where I started the biopsy and that slowed down bleeding.  The cervical block was done pretty deep in an effort to minimize blood loss.  With the straight scalpel, I made a circumferential incision.  I then used the angled 11 blade scalpel to make the cone portion.  When I felt that we were down to the internal os, I tagged the specimen at 12 o'clock with a 3-0 Vicryl.  Then, I continued  to amputate the biopsy.  Of note, the stay sutures were 0 Vicryl in the patient's right and 3-0 Vicryl on the left.  Once the specimen was removed, I used the sponge to hold some pressure at the base.  The rollerball cautery was then used on 50.  The  ectocervix was cauterized and then the cone bed.  There was some brisk bleeding noted  I thought from near the internal os, the posterior edge, so I used a [employer] sponge and inserted that  into the canal, held pressure and that adequately responded and  slowed significantly.  I then was able to cauterize the bed.  I ultimately put in some Lugol's which showed the bed was really dry.  Then I put in  G[place]am it was cut lengthwise and that was placed in the cone bed.  The right stay suture came off during  manipulation of the cervix, but there was no bleeding noted.  I then cut the other one.  I did use a single-tooth tenaculum high up on the cervix just for manipulation  and that tenaculum site looked good.  All instrument, sponge and needle counts were correct x3.  The patient tolerated the procedure well.  VN/NUANCE  D:09/19/2019 T:09/19/2019 JOB:[id number]

## 2019-09-20 ENCOUNTER — Encounter (HOSPITAL_BASED_OUTPATIENT_CLINIC_OR_DEPARTMENT_OTHER): Payer: Self-pay | Admitting: Obstetrics and Gynecology

## 2019-09-20 NOTE — Anesthesia Postprocedure Evaluation (Signed)
Anesthesia Post Note  Patient: Leah Robertson  Procedure(s) Performed: CONIZATION CERVIX WITH BIOPSY (N/A Vagina )     Patient location during evaluation: PACU Anesthesia Type: General Level of consciousness: awake and alert Pain management: pain level controlled Vital Signs Assessment: post-procedure vital signs reviewed and stable Respiratory status: spontaneous breathing, nonlabored ventilation, respiratory function stable and patient connected to nasal cannula oxygen Cardiovascular status: blood pressure returned to baseline and stable Postop Assessment: no apparent nausea or vomiting Anesthetic complications: no   No complications documented.  Last Vitals:  Vitals:   09/19/19 1620 09/19/19 1632  BP:  119/89  Pulse: 83 80  Resp: 16 16  Temp:  36.6 C  SpO2: 100% 100%    Last Pain:  Vitals:   09/19/19 1632  TempSrc:   PainSc: 0-No pain   Pain Goal:                   Barnet Glasgow

## 2019-09-21 LAB — SURGICAL PATHOLOGY

## 2020-02-22 ENCOUNTER — Ambulatory Visit: Payer: 59 | Attending: Internal Medicine

## 2020-02-22 DIAGNOSIS — Z23 Encounter for immunization: Secondary | ICD-10-CM

## 2020-02-22 NOTE — Progress Notes (Signed)
   Covid-19 Vaccination Clinic  Name:  Leah Robertson    MRN: [id number] DOB: [birth year]  02/22/2020  [name] was observed post Covid-19 immunization for 15 minutes without incident. She was provided with Vaccine Information Sheet and instruction to access the V-Safe system.   [name] was instructed to call 911 with any severe reactions post vaccine: Marland Kitchen Difficulty breathing  . Swelling of face and throat  . A fast heartbeat  . A bad rash all over body  . Dizziness and weakness   Immunizations Administered    Name Date Dose VIS Date Route   Pfizer COVID-19 Vaccine 02/22/2020  4:02 PM 0.3 mL 01/09/2020 Intramuscular   Manufacturer: [place]   Lot: [id number]   NDC: [address]-0

## 2020-04-21 ENCOUNTER — Other Ambulatory Visit: Payer: Self-pay | Admitting: Family Medicine

## 2020-04-21 DIAGNOSIS — Z1231 Encounter for screening mammogram for malignant neoplasm of breast: Secondary | ICD-10-CM

## 2020-06-05 ENCOUNTER — Ambulatory Visit: Payer: 59

## 2020-07-28 ENCOUNTER — Ambulatory Visit
Admission: RE | Admit: 2020-07-28 | Discharge: 2020-07-28 | Disposition: A | Discharge status: Home / Self Care | Payer: 59 | Source: Ambulatory Visit | Attending: Family Medicine | Admitting: Family Medicine

## 2020-07-28 ENCOUNTER — Other Ambulatory Visit: Payer: Self-pay

## 2020-07-28 ENCOUNTER — Ambulatory Visit: Payer: 59

## 2020-07-28 DIAGNOSIS — Z1231 Encounter for screening mammogram for malignant neoplasm of breast: Secondary | ICD-10-CM

## 2021-02-22 NOTE — H&P (Signed)
Leah Robertson is an 45 y.o. G2P2 who is admitted for Hysteroscopy with Dilation and Curettage, submucosal fibroid resection, and placement of Mirena Intrauterine device for AUB.  Patient reports heavy cycles over the recent several months. She is taking a COC for AUB which has helped up until recently.   TVUS (02/06/21): Uterus 10.55 x 7.71 x 8.85cm Fibroid 1: 4.43cm  fibroid 2: 3.18cm Right ovary 2.87cm  Left ovary 3.42cm Anteverted uterus. Uterine fibroid - left anterior calcified 2.8 x 2.7 x 3.1cm, slight decrease in size from 06/14/2018 [place]. Anterior midbody submucosal 4.4 x 3.9 x 4.4cm, increased in size from last [place]. Endometrium - unable to measure due to location and size of endometrium. Ovaries - wnl. No free fluid or adnexal masses seen.  Patient Active Problem List   Diagnosis Date Noted   Left hand pain 12/12/2017    MEDICAL/FAMILY/SOCIAL HX: No LMP recorded. (Menstrual status: Oral contraceptives).    Past Medical History:  Diagnosis Date   Anemia    PCOS (polycystic ovarian syndrome)     Past Surgical History:  Procedure Laterality Date   BREAST BIOPSY Left 06/2016   x2   CERVICAL CONIZATION W/BX N/A 09/19/2019   Procedure: CONIZATION CERVIX WITH BIOPSY;  Surgeon: Thurnell Lose, MD;  Location: [place];  Service: Gynecology;  Laterality: N/A;   CESAREAN SECTION      Family History  Problem Relation Age of Onset   Colon cancer Mother    Stroke Mother    Prostate cancer Father    Heart disease Father    Healthy Sister    Hypertension Brother    Diabetes Other     Social History:  reports that she has never smoked. She has never used smokeless tobacco. She reports that she does not drink alcohol and does not use drugs.  ALLERGIES/MEDS:  Allergies: No Known Allergies  No medications prior to admission.     Review of Systems  Constitutional: Negative.   HENT: Negative.    Eyes: Negative.   Respiratory: Negative.     Cardiovascular: Negative.   Gastrointestinal: Negative.   Genitourinary: Negative.   Musculoskeletal: Negative.   Skin: Negative.   Neurological: Negative.   Endo/Heme/Allergies: Negative.   Psychiatric/Behavioral: Negative.     There were no vitals taken for this visit. Gen:  NAD, pleasant and cooperative Cardio:  RRR Pulm:  CTAB, no wheezes/rales/rhonchi Abd:  Soft, non-distended, non-tender throughout, no rebound/guarding Ext:  No bilateral LE edema, no bilateral calf tenderness Pelvic: Labia - unremarkable, vagina - pink moist mucosa, no lesions or abnormal discharge, cervix - no discharge or lesions or CMT  No results found for this or any previous visit (from the past 24 hour(s)).  No results found.   ASSESSMENT/PLAN: Leah Robertson is a 45 y.o. G2P2 who is admitted for Hysteroscopy with Dilation and Curettage, submucosal fibroid resection, and placement of Mirena Intrauterine device for AUB.  - Admit to [facility] labs (CBC, T&S, COVID screen) - Diet:  Per anesthesia/ERAS pathway - IVF:  Per anesthesia - VTE Prophylaxis:  SCDs - Antibiotics: None - Mirena IUD ordered - D/C home same-day  Consents: I discussed with the patient that this surgery is performed to look inside the uterus and remove the uterine lining and resect the submucosal fibroid.  Prior to surgery, the risks and benefits of the surgery, as well as alternative treatments, have been discussed.  The risks include, but are not limited to bleeding, including the need for  a blood transfusion, infection, damage to organs and tissues, including uterine perforation, requiring additional surgery, postoperative pain, short-term and long-term, failure of the procedure to control symptoms, need for hysterectomy to control bleeding, fluid overload, which could create electrolyte abnormalities and the need to stop the procedure before completion, inability to safely complete the procedure, deep vein thrombosis  and/or pulmonary embolism, painful intercourse, complications the course of which cannot be predicted or prevented, and death.  I discussed with patient that the risks of IUD placement include expulsion, malposition, or migration/uterine perforation. Patient was consented for blood products.  The patient is aware that bleeding may result in the need for a blood transfusion which includes risk of transmission of HIV (1:2 million), Hepatitis C (1:2 million), and Hepatitis B (1:200 thousand) and transfusion reaction.  Patient voiced understanding of the above risks as well as understanding of indications for blood transfusion.   Drema Dallas, DO [phone] (office)

## 2021-03-05 ENCOUNTER — Other Ambulatory Visit: Payer: Self-pay

## 2021-03-05 ENCOUNTER — Encounter (HOSPITAL_BASED_OUTPATIENT_CLINIC_OR_DEPARTMENT_OTHER): Payer: Self-pay | Admitting: Obstetrics and Gynecology

## 2021-03-09 ENCOUNTER — Encounter (HOSPITAL_BASED_OUTPATIENT_CLINIC_OR_DEPARTMENT_OTHER)
Admission: RE | Admit: 2021-03-09 | Discharge: 2021-03-09 | Disposition: A | Discharge status: Home / Self Care | Payer: 59 | Source: Ambulatory Visit | Attending: Pediatrics | Admitting: Pediatrics

## 2021-03-09 DIAGNOSIS — Z01812 Encounter for preprocedural laboratory examination: Secondary | ICD-10-CM | POA: Insufficient documentation

## 2021-03-09 DIAGNOSIS — N939 Abnormal uterine and vaginal bleeding, unspecified: Secondary | ICD-10-CM | POA: Diagnosis not present

## 2021-03-09 DIAGNOSIS — D250 Submucous leiomyoma of uterus: Secondary | ICD-10-CM | POA: Diagnosis not present

## 2021-03-09 DIAGNOSIS — E282 Polycystic ovarian syndrome: Secondary | ICD-10-CM | POA: Diagnosis not present

## 2021-03-09 LAB — TYPE AND SCREEN
ABO/RH(D): O POS
Antibody Screen: NEGATIVE

## 2021-03-09 LAB — POCT PREGNANCY, URINE: Preg Test, Ur: NEGATIVE

## 2021-03-09 NOTE — Progress Notes (Signed)

## 2021-03-10 LAB — CBC
HCT: 38.2 % (ref 36.0–46.0)
Hemoglobin: 11.9 g/dL — ABNORMAL LOW (ref 12.0–15.0)
MCH: 25.3 pg — ABNORMAL LOW (ref 26.0–34.0)
MCHC: 31.2 g/dL (ref 30.0–36.0)
MCV: 81.1 fL (ref 80.0–100.0)
Platelets: 276 10*3/uL (ref 150–400)
RBC: 4.71 MIL/uL (ref 3.87–5.11)
RDW: 16.1 % — ABNORMAL HIGH (ref 11.5–15.5)
WBC: 4.8 10*3/uL (ref 4.0–10.5)
nRBC: 0 % (ref 0.0–0.2)

## 2021-03-11 ENCOUNTER — Encounter (HOSPITAL_BASED_OUTPATIENT_CLINIC_OR_DEPARTMENT_OTHER)
Admission: RE | Disposition: A | Discharge status: Home / Self Care | Payer: Self-pay | Source: Ambulatory Visit | Attending: Obstetrics and Gynecology

## 2021-03-11 ENCOUNTER — Encounter (HOSPITAL_BASED_OUTPATIENT_CLINIC_OR_DEPARTMENT_OTHER): Payer: Self-pay | Admitting: Obstetrics and Gynecology

## 2021-03-11 ENCOUNTER — Ambulatory Visit (HOSPITAL_BASED_OUTPATIENT_CLINIC_OR_DEPARTMENT_OTHER)
Admission: RE | Admit: 2021-03-11 | Discharge: 2021-03-11 | Disposition: A | Discharge status: Home / Self Care | Payer: 59 | Source: Ambulatory Visit | Attending: Obstetrics and Gynecology | Admitting: Obstetrics and Gynecology

## 2021-03-11 ENCOUNTER — Ambulatory Visit (HOSPITAL_BASED_OUTPATIENT_CLINIC_OR_DEPARTMENT_OTHER): Payer: 59 | Admitting: Anesthesiology

## 2021-03-11 ENCOUNTER — Other Ambulatory Visit: Payer: Self-pay

## 2021-03-11 DIAGNOSIS — D250 Submucous leiomyoma of uterus: Secondary | ICD-10-CM | POA: Diagnosis not present

## 2021-03-11 DIAGNOSIS — E282 Polycystic ovarian syndrome: Secondary | ICD-10-CM | POA: Insufficient documentation

## 2021-03-11 DIAGNOSIS — N870 Mild cervical dysplasia: Secondary | ICD-10-CM | POA: Insufficient documentation

## 2021-03-11 DIAGNOSIS — N939 Abnormal uterine and vaginal bleeding, unspecified: Secondary | ICD-10-CM

## 2021-03-11 DIAGNOSIS — R8789 Other abnormal findings in specimens from female genital organs: Secondary | ICD-10-CM | POA: Insufficient documentation

## 2021-03-11 DIAGNOSIS — N920 Excessive and frequent menstruation with regular cycle: Secondary | ICD-10-CM | POA: Insufficient documentation

## 2021-03-11 DIAGNOSIS — N951 Menopausal and female climacteric states: Secondary | ICD-10-CM | POA: Insufficient documentation

## 2021-03-11 DIAGNOSIS — N879 Dysplasia of cervix uteri, unspecified: Secondary | ICD-10-CM | POA: Insufficient documentation

## 2021-03-11 DIAGNOSIS — R87810 Cervical high risk human papillomavirus (HPV) DNA test positive: Secondary | ICD-10-CM | POA: Insufficient documentation

## 2021-03-11 HISTORY — PX: DILATATION & CURETTAGE/HYSTEROSCOPY WITH MYOSURE: SHX6511

## 2021-03-11 HISTORY — PX: INTRAUTERINE DEVICE (IUD) INSERTION: SHX5877

## 2021-03-11 SURGERY — DILATATION & CURETTAGE/HYSTEROSCOPY WITH MYOSURE
Anesthesia: General

## 2021-03-11 MED ORDER — MIDAZOLAM HCL 2 MG/2ML IJ SOLN
INTRAMUSCULAR | Status: AC
  Filled 2021-03-11: qty 2

## 2021-03-11 MED ORDER — PROMETHAZINE HCL 25 MG/ML IJ SOLN: 6.2500 mg | INTRAMUSCULAR | Status: DC | PRN

## 2021-03-11 MED ORDER — OXYCODONE HCL 5 MG/5ML PO SOLN: 5.0000 mg | Freq: Once | ORAL | Status: DC | PRN

## 2021-03-11 MED ORDER — LEVONORGESTREL 20 MCG/DAY IU IUD
1.0000 | INTRAUTERINE_SYSTEM | INTRAUTERINE | Status: AC
  Administered 2021-03-11: 14:00:00 1 via INTRAUTERINE
  Filled 2021-03-11: qty 1

## 2021-03-11 MED ORDER — HYDROMORPHONE HCL 1 MG/ML IJ SOLN: 0.2500 mg | INTRAMUSCULAR | Status: DC | PRN

## 2021-03-11 MED ORDER — OXYCODONE HCL 5 MG PO TABS: 5.0000 mg | ORAL_TABLET | Freq: Once | ORAL | Status: DC | PRN

## 2021-03-11 MED ORDER — FENTANYL CITRATE (PF) 100 MCG/2ML IJ SOLN: 25.0000 ug | INTRAMUSCULAR | Status: DC | PRN

## 2021-03-11 MED ORDER — MEPERIDINE HCL 25 MG/ML IJ SOLN: 6.2500 mg | INTRAMUSCULAR | Status: DC | PRN

## 2021-03-11 MED ORDER — ACETAMINOPHEN 500 MG PO TABS: 1000.0000 mg | ORAL_TABLET | Freq: Once | ORAL | Status: DC

## 2021-03-11 MED ORDER — MIDAZOLAM HCL 5 MG/5ML IJ SOLN
INTRAMUSCULAR | Status: DC | PRN
  Administered 2021-03-11: 2 mg via INTRAVENOUS

## 2021-03-11 MED ORDER — PROPOFOL 10 MG/ML IV BOLUS
INTRAVENOUS | Status: DC | PRN
  Administered 2021-03-11: 200 mg via INTRAVENOUS

## 2021-03-11 MED ORDER — IBUPROFEN 800 MG PO TABS: 800.0000 mg | ORAL_TABLET | Freq: Three times a day (TID) | ORAL | 0 refills | Status: AC | PRN

## 2021-03-11 MED ORDER — BUPIVACAINE HCL (PF) 0.25 % IJ SOLN
INTRAMUSCULAR | Status: AC
  Filled 2021-03-11: qty 30

## 2021-03-11 MED ORDER — AMISULPRIDE (ANTIEMETIC) 5 MG/2ML IV SOLN: 10.0000 mg | Freq: Once | INTRAVENOUS | Status: DC | PRN

## 2021-03-11 MED ORDER — FENTANYL CITRATE (PF) 100 MCG/2ML IJ SOLN
INTRAMUSCULAR | Status: AC
  Filled 2021-03-11: qty 2

## 2021-03-11 MED ORDER — SILVER NITRATE-POT NITRATE 75-25 % EX MISC
CUTANEOUS | Status: DC | PRN
  Administered 2021-03-11: 8
  Administered 2021-03-11: 6

## 2021-03-11 MED ORDER — ONDANSETRON HCL 4 MG/2ML IJ SOLN
INTRAMUSCULAR | Status: DC | PRN
  Administered 2021-03-11: 4 mg via INTRAVENOUS

## 2021-03-11 MED ORDER — SCOPOLAMINE 1 MG/3DAYS TD PT72: 1.0000 | MEDICATED_PATCH | Freq: Once | TRANSDERMAL | Status: DC

## 2021-03-11 MED ORDER — DEXAMETHASONE SODIUM PHOSPHATE 10 MG/ML IJ SOLN
INTRAMUSCULAR | Status: DC | PRN
  Administered 2021-03-11: 5 mg via INTRAVENOUS

## 2021-03-11 MED ORDER — LIDOCAINE 2% (20 MG/ML) 5 ML SYRINGE
INTRAMUSCULAR | Status: AC
  Filled 2021-03-11: qty 5

## 2021-03-11 MED ORDER — SODIUM CHLORIDE 0.9 % IR SOLN
Status: DC | PRN
  Administered 2021-03-11: 4000 mL

## 2021-03-11 MED ORDER — FENTANYL CITRATE (PF) 100 MCG/2ML IJ SOLN
INTRAMUSCULAR | Status: DC | PRN
  Administered 2021-03-11 (×4): 25 ug via INTRAVENOUS

## 2021-03-11 MED ORDER — LACTATED RINGERS IV SOLN: INTRAVENOUS | Status: DC

## 2021-03-11 MED ORDER — PROPOFOL 10 MG/ML IV BOLUS
INTRAVENOUS | Status: AC
  Filled 2021-03-11: qty 20

## 2021-03-11 MED ORDER — LIDOCAINE HCL (CARDIAC) PF 100 MG/5ML IV SOSY
PREFILLED_SYRINGE | INTRAVENOUS | Status: DC | PRN
  Administered 2021-03-11: 100 mg via INTRAVENOUS

## 2021-03-11 SURGICAL SUPPLY — 22 items
CANISTER SUCT 1200ML W/VALVE (MISCELLANEOUS) ×3 IMPLANT
CATH ROBINSON RED A/P 16FR (CATHETERS) ×3 IMPLANT
DEVICE MYOSURE LITE (MISCELLANEOUS) IMPLANT
DEVICE MYOSURE REACH (MISCELLANEOUS) IMPLANT
DILATOR CANAL MILEX (MISCELLANEOUS) ×2 IMPLANT
DRSG TELFA 3X8 NADH (GAUZE/BANDAGES/DRESSINGS) ×3 IMPLANT
GAUZE 4X4 16PLY ~~LOC~~+RFID DBL (SPONGE) ×3 IMPLANT
GLOVE SURG ENC MOIS LTX SZ6.5 (GLOVE) ×3 IMPLANT
GLOVE SURG ENC TEXT LTX SZ6.5 (GLOVE) ×3 IMPLANT
GLOVE SURG UNDER POLY LF SZ7 (GLOVE) ×3 IMPLANT
GOWN STRL REUS W/ TWL LRG LVL3 (GOWN DISPOSABLE) ×2 IMPLANT
GOWN STRL REUS W/TWL LRG LVL3 (GOWN DISPOSABLE) ×6
KIT PROCEDURE FLUENT (KITS) ×3 IMPLANT
KIT TURNOVER KIT B (KITS) ×3 IMPLANT
MIRENA ×2 IMPLANT
PACK VAGINAL MINOR WOMEN LF (CUSTOM PROCEDURE TRAY) ×3 IMPLANT
PAD DRESSING TELFA 3X8 NADH (GAUZE/BANDAGES/DRESSINGS) IMPLANT
PAD OB MATERNITY 4.3X12.25 (PERSONAL CARE ITEMS) ×3 IMPLANT
SEAL ROD LENS SCOPE MYOSURE (ABLATOR) ×3 IMPLANT
SLEEVE SCD COMPRESS KNEE MED (STOCKING) ×3 IMPLANT
TOWEL GREEN STERILE FF (TOWEL DISPOSABLE) ×6 IMPLANT
UNDERPAD 30X36 HEAVY ABSORB (UNDERPADS AND DIAPERS) ×3 IMPLANT

## 2021-03-11 NOTE — Interval H&P Note (Signed)
History and Physical Interval Note:  03/11/2021 1:29 PM  Leah Robertson  has presented today for surgery, with the diagnosis of Abnormal Uterine Bleeding.  The various methods of treatment have been discussed with the patient and family. After consideration of risks, benefits and other options for treatment, the patient has consented to: Hysteroscopy, Dilation and Curettage, Myosure resection of submucosal fibroid, and placement of intrauterine device as a surgical intervention.  The patient's history has been reviewed, patient examined, no change in status, stable for surgery.  I have reviewed the patient's chart and labs.  Questions were answered to the patient's satisfaction.     Drema Dallas

## 2021-03-11 NOTE — Anesthesia Procedure Notes (Signed)
Procedure Name: LMA Insertion Date/Time: 03/11/2021 1:45 PM Performed by: Myna Bright, CRNA Pre-anesthesia Checklist: Patient identified, Emergency Drugs available, Suction available and Patient being monitored Patient Re-evaluated:Patient Re-evaluated prior to induction Oxygen Delivery Method: Circle system utilized Preoxygenation: Pre-oxygenation with 100% oxygen Induction Type: IV induction Ventilation: Mask ventilation without difficulty LMA: LMA inserted LMA Size: 4.0 Number of attempts: 1 Placement Confirmation: positive ETCO2 and breath sounds checked- equal and bilateral Tube secured with: Tape Dental Injury: Teeth and Oropharynx as per pre-operative assessment

## 2021-03-11 NOTE — Anesthesia Postprocedure Evaluation (Signed)
Anesthesia Post Note  Patient: Leah Robertson  Procedure(s) Performed: DILATATION & CURETTAGE/HYSTEROSCOPY WITH MYOSURE RESECTION OF SUBMUCOSAL FIBROID INTRAUTERINE DEVICE (IUD) INSERTION     Patient location during evaluation: PACU Anesthesia Type: General Level of consciousness: awake and alert Pain management: pain level controlled Vital Signs Assessment: post-procedure vital signs reviewed and stable Respiratory status: spontaneous breathing, nonlabored ventilation and respiratory function stable Cardiovascular status: blood pressure returned to baseline and stable Postop Assessment: no apparent nausea or vomiting Anesthetic complications: no   No notable events documented.  Last Vitals:  Vitals:   03/11/21 1500 03/11/21 1515  BP: 119/76 119/79  Pulse: 70 73  Resp: 12 12  Temp:    SpO2: 100% 99%    Last Pain:  Vitals:   03/11/21 1500  TempSrc:   PainSc: 1                  Lynda Rainwater

## 2021-03-11 NOTE — Anesthesia Preprocedure Evaluation (Deleted)
Anesthesia Evaluation  Patient identified by MRN, date of birth, ID band Patient awake    Reviewed: Allergy & Precautions, NPO status , Patient's Chart, lab work & pertinent test results  History of Anesthesia Complications Negative for: history of anesthetic complications  Airway Mallampati: II  TM Distance: >3 FB Neck ROM: Full    Dental no notable dental hx.    Pulmonary neg pulmonary ROS,    Pulmonary exam normal breath sounds clear to auscultation       Cardiovascular negative cardio ROS Normal cardiovascular exam Rhythm:Regular Rate:Normal     Neuro/Psych negative neurological ROS  negative psych ROS   GI/Hepatic negative GI ROS, Neg liver ROS,   Endo/Other  negative endocrine ROS  Renal/GU negative Renal ROS  negative genitourinary   Musculoskeletal negative musculoskeletal ROS (+)   Abdominal (+) + obese,   Peds negative pediatric ROS (+)  Hematology negative hematology ROS (+) anemia , Hgb 11.9   Anesthesia Other Findings Day of surgery medications reviewed with patient.  Reproductive/Obstetrics negative OB ROS AUB                                                             Anesthesia Evaluation  Patient identified by MRN, date of birth, ID band Patient awake    Reviewed: Allergy & Precautions, NPO status , Patient's Chart, lab work & pertinent test results  Airway Mallampati: II  TM Distance: >3 FB Neck ROM: Full    Dental no notable dental hx. (+) Teeth Intact, Dental Advisory Given   Pulmonary neg pulmonary ROS,    Pulmonary exam normal breath sounds clear to auscultation       Cardiovascular Exercise Tolerance: Good negative cardio ROS Normal cardiovascular exam Rhythm:Regular Rate:Normal     Neuro/Psych negative neurological ROS  negative psych ROS   GI/Hepatic negative GI ROS, Neg liver ROS,   Endo/Other  negative endocrine ROS   Renal/GU negative Renal ROS     Musculoskeletal negative musculoskeletal ROS (+)   Abdominal   Peds  Hematology  (+) anemia ,   Anesthesia Other Findings   Reproductive/Obstetrics                           Anesthesia Physical Anesthesia Plan  ASA: II  Anesthesia Plan: General   Post-op Pain Management:    Induction: Intravenous  PONV Risk Score and Plan: 4 or greater and Treatment may vary due to age or medical condition, Ondansetron, Dexamethasone and Midazolam  Airway Management Planned: LMA  Additional Equipment: None  Intra-op Plan:   Post-operative Plan: Extubation in OR  Informed Consent: I have reviewed the patients History and Physical, chart, labs and discussed the procedure including the risks, benefits and alternatives for the proposed anesthesia with the patient or authorized representative who has indicated his/her understanding and acceptance.     Dental advisory given  Plan Discussed with: CRNA  Anesthesia Plan Comments:        Anesthesia Quick Evaluation  Anesthesia Physical Anesthesia Plan  ASA: 2  Anesthesia Plan: General   Post-op Pain Management:    Induction: Intravenous  PONV Risk Score and Plan: 3 and Treatment may vary due to age or medical condition, Ondansetron, Dexamethasone and Midazolam  Airway Management Planned: LMA  Additional Equipment: None  Intra-op Plan:   Post-operative Plan: Extubation in OR  Informed Consent:   Plan Discussed with:   Anesthesia Plan Comments:        Anesthesia Quick Evaluation

## 2021-03-11 NOTE — Op Note (Addendum)
Pre Op Dx:   1. Abnormal uterine bleeding 2. Submucosal fibroid 3. Fibroid uterus 4. Desires Intrauterine device placement  Post Op Dx:   Same as pre-operative diagnoses  Procedure:   1. Hysteroscopy with Dilation and Curettage 2. Myosure resection of submucosal fibroid 3. Placement of Mirena Intrauterine Device   Surgeon:  Dr. Drema Dallas Assistants:  None Anesthesia:  General   EBL:  20cc  IVF:  See anesthesia documentation UOP:  See anesthesia documentation Fluid Deficit:  Less than 1000cc (approximately 985cc)   Drains:  None Specimen removed:  Endometrial curettings and portion of submucosal fibroid - sent to pathology Device(s) implanted: Mirena IUD   Lot No. [id number]   Expiration Oct 2024 Case Type:  Clean-contaminated Findings:  Normal-appearing cervix. Anterior ~4.5cm submucosal fibroid present. Bilateral tubal ostia visualized. Uterus sounded to 8.5cm. Complications: None Indications:  45 y.o. G2P2 with AUB despite medical management with COCs and with a known submucosal fibroid who desired resection and placement of IUD for ongoing management of AUB. Description of each procedure:  After informed consent was obtained the patient was taken to the operating room in the dorsal supine position.  After administration of general anesthesia, the patient was placed in the dorsal lithotomy position and prepped and draped in the usual sterile fashion. A pre-operative time-out was completed.  The anterior lip of the cervix was grasped with a single-tooth tenaculum, uterus sounded to 8.5cm and the cervix was serially dilated to accommodate the hysteroscope.  The hysteroscope was advanced and the findings as above was noted. The Myosure XL was used to resect the anterior submucosal fibroid and majority was resected. The A sharp banjo curette was used to curettage the endometrium. The uterus was re-sounded to 8.5cm and the Mirena IUD was inserted using its insertor device in standard  fashion. The IUD strings were trimmed to ~3cm in length.  The The single-tooth tenaculum was removed and its sites were made hemostatic with silver nitrate and pressure.  Adequate hemostasis was noted.  The patient was awakened and extubated and appeared to have tolerated the procedure well.  All counts were correct. Disposition:  PACU  Drema Dallas, DO

## 2021-03-11 NOTE — Discharge Instructions (Addendum)

## 2021-03-11 NOTE — Transfer of Care (Signed)
Immediate Anesthesia Transfer of Care Note  Patient: Leah Robertson  Procedure(s) Performed: DILATATION & CURETTAGE/HYSTEROSCOPY WITH MYOSURE RESECTION OF SUBMUCOSAL FIBROID INTRAUTERINE DEVICE (IUD) INSERTION  Patient Location: PACU  Anesthesia Type:General  Level of Consciousness: drowsy  Airway & Oxygen Therapy: Patient Spontanous Breathing and Patient connected to face mask oxygen  Post-op Assessment: Report given to RN and Post -op Vital signs reviewed and stable  Post vital signs: Reviewed and stable  Last Vitals:  Vitals Value Taken Time  BP 113/79 03/11/21 1438  Temp    Pulse 80 03/11/21 1439  Resp 14 03/11/21 1439  SpO2 100 % 03/11/21 1439  Vitals shown include unvalidated device data.  Last Pain:  Vitals:   03/11/21 1115  TempSrc: Oral  PainSc: 0-No pain         Complications: No notable events documented.

## 2021-03-11 NOTE — Anesthesia Preprocedure Evaluation (Signed)
Anesthesia Evaluation  Patient identified by MRN, date of birth, ID band Patient awake    Reviewed: Allergy & Precautions, NPO status , Patient's Chart, lab work & pertinent test results  Airway Mallampati: II  TM Distance: >3 FB Neck ROM: Full    Dental no notable dental hx.    Pulmonary neg pulmonary ROS,    Pulmonary exam normal breath sounds clear to auscultation       Cardiovascular negative cardio ROS Normal cardiovascular exam Rhythm:Regular Rate:Normal     Neuro/Psych negative neurological ROS  negative psych ROS   GI/Hepatic negative GI ROS, Neg liver ROS,   Endo/Other  negative endocrine ROS  Renal/GU negative Renal ROS  negative genitourinary   Musculoskeletal negative musculoskeletal ROS (+)   Abdominal (+) + obese,   Peds negative pediatric ROS (+)  Hematology negative hematology ROS (+)   Anesthesia Other Findings   Reproductive/Obstetrics negative OB ROS                             Anesthesia Physical Anesthesia Plan  ASA: 2  Anesthesia Plan: General   Post-op Pain Management:    Induction: Intravenous  PONV Risk Score and Plan: 3 and Ondansetron, Dexamethasone, Midazolam and Treatment may vary due to age or medical condition  Airway Management Planned: LMA  Additional Equipment:   Intra-op Plan:   Post-operative Plan: Extubation in OR  Informed Consent: I have reviewed the patients History and Physical, chart, labs and discussed the procedure including the risks, benefits and alternatives for the proposed anesthesia with the patient or authorized representative who has indicated his/her understanding and acceptance.     Dental advisory given  Plan Discussed with: CRNA  Anesthesia Plan Comments:         Anesthesia Quick Evaluation

## 2021-03-12 ENCOUNTER — Encounter (HOSPITAL_BASED_OUTPATIENT_CLINIC_OR_DEPARTMENT_OTHER): Payer: Self-pay | Admitting: Obstetrics and Gynecology

## 2021-03-12 LAB — SURGICAL PATHOLOGY

## 2021-03-12 NOTE — Progress Notes (Signed)
Left message stating courtesy call and if any questions or concerns please call the doctors office.  

## 2021-07-10 ENCOUNTER — Other Ambulatory Visit: Payer: Self-pay | Admitting: Family Medicine

## 2021-07-10 DIAGNOSIS — Z1231 Encounter for screening mammogram for malignant neoplasm of breast: Secondary | ICD-10-CM

## 2021-07-29 ENCOUNTER — Ambulatory Visit
Admission: RE | Admit: 2021-07-29 | Discharge: 2021-07-29 | Disposition: A | Discharge status: Home / Self Care | Payer: No Typology Code available for payment source | Source: Ambulatory Visit | Attending: Family Medicine | Admitting: Family Medicine

## 2021-07-29 DIAGNOSIS — Z1231 Encounter for screening mammogram for malignant neoplasm of breast: Secondary | ICD-10-CM

## 2021-07-30 ENCOUNTER — Other Ambulatory Visit: Payer: Self-pay | Admitting: Family Medicine

## 2021-07-30 DIAGNOSIS — R928 Other abnormal and inconclusive findings on diagnostic imaging of breast: Secondary | ICD-10-CM

## 2021-08-07 ENCOUNTER — Ambulatory Visit: Payer: No Typology Code available for payment source

## 2021-08-07 ENCOUNTER — Ambulatory Visit
Admission: RE | Admit: 2021-08-07 | Discharge: 2021-08-07 | Disposition: A | Discharge status: Home / Self Care | Payer: No Typology Code available for payment source | Source: Ambulatory Visit | Attending: Family Medicine | Admitting: Family Medicine

## 2021-08-07 DIAGNOSIS — R928 Other abnormal and inconclusive findings on diagnostic imaging of breast: Secondary | ICD-10-CM

## 2022-06-25 ENCOUNTER — Other Ambulatory Visit: Payer: Self-pay | Admitting: Interventional Radiology

## 2022-06-25 ENCOUNTER — Other Ambulatory Visit: Payer: Self-pay | Admitting: Obstetrics and Gynecology

## 2022-06-25 DIAGNOSIS — D259 Leiomyoma of uterus, unspecified: Secondary | ICD-10-CM

## 2022-06-27 ENCOUNTER — Ambulatory Visit
Admission: RE | Admit: 2022-06-27 | Discharge: 2022-06-27 | Discharge status: Home / Self Care | Payer: No Typology Code available for payment source | Source: Ambulatory Visit | Attending: Interventional Radiology

## 2022-06-27 DIAGNOSIS — D259 Leiomyoma of uterus, unspecified: Secondary | ICD-10-CM

## 2022-06-27 MED ORDER — GADOPICLENOL 0.5 MMOL/ML IV SOLN
9.0000 mL | Freq: Once | INTRAVENOUS | Status: AC | PRN
  Administered 2022-06-27: 9 mL via INTRAVENOUS

## 2022-06-29 ENCOUNTER — Ambulatory Visit
Admission: RE | Admit: 2022-06-29 | Discharge: 2022-06-29 | Disposition: A | Discharge status: Home / Self Care | Payer: No Typology Code available for payment source | Source: Ambulatory Visit | Attending: Obstetrics and Gynecology | Admitting: Obstetrics and Gynecology

## 2022-06-29 ENCOUNTER — Other Ambulatory Visit: Payer: Self-pay | Admitting: Interventional Radiology

## 2022-06-29 DIAGNOSIS — D259 Leiomyoma of uterus, unspecified: Secondary | ICD-10-CM

## 2022-06-29 HISTORY — PX: IR RADIOLOGIST EVAL & MGMT: IMG5224

## 2022-06-29 NOTE — Consult Note (Signed)
Chief Complaint: Patient was seen in consultation today for uterine fibroids at the request of Cousins,Sheronette  Referring Physician(s): Cousins,Sheronette  History of Present Illness: Leah Robertson is a 47 y.o. female (G2, P2) with a long history of uterine fibroids.  She was initially diagnosed with fibroids more than 20 years ago.  Her periods are regular and occur monthly.  They last for approximately 2 weeks.  The first week is extremely heavy.  She requires changing of superabsorbent pads every 2-3 hours.  She has been diagnosed with anemia and low iron multiple times.  She has undergone attempted myomectomy in December 2022 and currently has an IUD in place.  Pap smear on 05/18/2022 was normal.  She thinks she also had an endometrial biopsy in December 2022.  MR imaging was obtained and is dated 06/27/2022.  MRI demonstrates a prominent 4.5 cm submucosal fibroid with a significant intracavitary component in the right fundus.  Additionally, she has multiple other small 1 cm fibroids and 1 devascularized intramural fibroid.  No evidence of adenomyosis or other complicating features.  Currently, she feels that the uterine fibroids and significant bleeding symptoms are ruling her life.  She is eager to pursue some form of treatment and obtain relief.  Past Medical History:  Diagnosis Date   Anemia    PCOS (polycystic ovarian syndrome)     Past Surgical History:  Procedure Laterality Date   BREAST BIOPSY Left 06/2016   x2   CERVICAL CONIZATION W/BX N/A 09/19/2019   Procedure: CONIZATION CERVIX WITH BIOPSY;  Surgeon: Geryl Rankins, MD;  Location: [facility];  Service: Gynecology;  Laterality: N/A;   CESAREAN SECTION     DILATATION & CURETTAGE/HYSTEROSCOPY WITH MYOSURE N/A 03/11/2021   Procedure: DILATATION & CURETTAGE/HYSTEROSCOPY WITH MYOSURE RESECTION OF SUBMUCOSAL FIBROID;  Surgeon: Steva Ready, DO;  Location: [facility];  Service:  Gynecology;  Laterality: N/A;   INTRAUTERINE DEVICE (IUD) INSERTION N/A 03/11/2021   Procedure: INTRAUTERINE DEVICE (IUD) INSERTION;  Surgeon: Steva Ready, DO;  Location: [facility];  Service: Gynecology;  Laterality: N/A;   IR RADIOLOGIST EVAL & MGMT  06/29/2022    Allergies: Patient has no known allergies.  Medications: Prior to Admission medications   Medication Sig Start Date End Date Taking? Authorizing Provider  clobetasol ointment (TEMOVATE) 0.05 %  12/04/17   [provider]  Fe Fum-FePoly-Vit C-Vit B3 (INTEGRA PO) Take by mouth.    [provider]  ibuprofen (ADVIL) 800 MG tablet Take 1 tablet (800 mg total) by mouth every 8 (eight) hours as needed. 03/11/21   Steva Ready, DO  Multiple Vitamin (MULTIVITAMIN) tablet Take 1 tablet by mouth daily.    [provider]     Family History  Problem Relation Age of Onset   Colon cancer Mother    Stroke Mother    Prostate cancer Father    Heart disease Father    Healthy Sister    Hypertension Brother    Diabetes Other     Social History   Socioeconomic History   Marital status: Married    Spouse name: Not on file   Number of children: Not on file   Years of education: Not on file   Highest education level: Not on file  Occupational History   Not on file  Tobacco Use   Smoking status: Never   Smokeless tobacco: Never  Vaping Use   Vaping Use: Never used  Substance and Sexual Activity  Alcohol use: Never   Drug use: Never   Sexual activity: Not on file  Other Topics Concern   Not on file  Social History Narrative   She works as a Emergency planning/management officer.     She lives with husband and two children   Highest level of education:  BS   Social Determinants of Health   Financial Resource Strain: Not on file  Food Insecurity: Not on file  Transportation Needs: Not on file  Physical Activity: Not on file  Stress: Not on file  Social Connections: Not on file    Review of  Systems: A 12 point ROS discussed and pertinent positives are indicated in the HPI above.  All other systems are negative.  Review of Systems  Vital Signs: BP (!) 124/90 (BP Location: Left Arm, Patient Position: Sitting, Cuff Size: Normal)   Pulse 78   Temp 98.1 F (36.7 C)   Resp 16   SpO2 98%     Physical Exam Constitutional:      Appearance: Normal appearance.  HENT:     Head: Normocephalic and atraumatic.  Eyes:     General: No scleral icterus. Cardiovascular:     Rate and Rhythm: Normal rate.  Pulmonary:     Effort: Pulmonary effort is normal.  Abdominal:     General: There is no distension.     Palpations: Abdomen is soft.     Tenderness: There is no abdominal tenderness.  Skin:    General: Skin is warm and dry.  Neurological:     Mental Status: She is alert and oriented to person, place, and time.  Psychiatric:        Behavior: Behavior normal.       Imaging: IR Radiologist Eval & Mgmt  Result Date: 06/29/2022 EXAM: NEW PATIENT OFFICE VISIT CHIEF COMPLAINT: SEE EPIC NOTE HISTORY OF PRESENT ILLNESS: SEE EPIC NOTE REVIEW OF SYSTEMS: SEE EPIC NOTE PHYSICAL EXAMINATION: SEE EPIC NOTE ASSESSMENT AND PLAN: SEE EPIC NOTE Electronically Signed   By: Malachy Moan M.D.   On: 06/29/2022 10:58   MR PELVIS W WO CONTRAST  Result Date: 06/27/2022 CLINICAL DATA:  Symptomatic uterine fibroids. EXAM: MRI PELVIS WITHOUT AND WITH CONTRAST TECHNIQUE: Multiplanar multisequence MR imaging of the pelvis was performed both before and after administration of intravenous contrast. CONTRAST:  9 mL Vueway COMPARISON:  None Available. FINDINGS: Lower Urinary Tract: No bladder or urethral abnormality identified. Bowel:  Unremarkable visualized pelvic bowel loops. Vascular/Lymphatic: No pathologically enlarged lymph nodes or other significant abnormality. Reproductive: -- Uterus: Measures 11.6 x 9.2 by 9.6 cm (volume = 540 cm^3). A submucosal fibroid is seen in the right fundal region which  has a significant intracavitary component. This measures 4.5 cm in maximum diameter. A 3.1 cm intramural fibroid is seen in the left anterior fundus which shows lack of contrast enhancement. Multiple other tiny less than 1 cm intramural fibroids are seen. No pedunculated fibroids identified. -- Right ovary:  Appears normal.  No mass identified. -- Left ovary:  Appears normal.  No mass identified. Other: No abnormal free fluid. Musculoskeletal:  Unremarkable. IMPRESSION: 4.5 cm submucosal fibroid with significant intracavitary component located in the right fundal region. 3.1 cm intramural fibroid in the left anterior fundus, which shows lack of contrast enhancement. Multiple other tiny less than 1 cm intramural fibroids. Normal appearance of both ovaries. No adnexal mass identified. Electronically Signed   By: Danae Orleans M.D.   On: 06/27/2022 14:39    Labs:  CBC: No  results for input(s): "WBC", "HGB", "HCT", "PLT" in the last 8760 hours.  COAGS: No results for input(s): "INR", "APTT" in the last 8760 hours.  BMP: No results for input(s): "NA", "K", "CL", "CO2", "GLUCOSE", "BUN", "CALCIUM", "CREATININE", "GFRNONAA", "GFRAA" in the last 8760 hours.  Invalid input(s): "CMP"  LIVER FUNCTION TESTS: No results for input(s): "BILITOT", "AST", "ALT", "ALKPHOS", "PROT", "ALBUMIN" in the last 8760 hours.  TUMOR MARKERS: No results for input(s): "AFPTM", "CEA", "CA199", "CHROMGRNA" in the last 8760 hours.  Assessment and Plan:  Very pleasant 47 year old female with highly symptomatic uterine fibroids.  Her uterine fibroid symptom severity score is a 69 out of 100 indicating severe symptoms.  Additionally, her health-related quality of life questionnaire was a 5 out of 100 indicating severe detriment to her overall quality of life.  We discussed the natural history of uterine fibroids as well as the treatment options including hormonal manipulation, myomectomy, hysterectomy and uterine artery  embolization.  At this time, she is highly interested in uterine artery embolization.  We discussed the risks, benefits and alternatives as well as the expected recovery.  She understands and is motivated to proceed.  1.) Schedule for superior hypogastric nerve block and concurrent bilateral uterine artery embolization.  Patient prefers April 25 if possible.  Thank you for this interesting consult.  I greatly enjoyed meeting CarMaxMiyako A Teixeira and look forward to participating in their care.  A copy of this report was sent to the requesting provider on this date.  Electronically Signed: Sterling BigHeath K Maybelline Kolarik 06/29/2022, 1:43 PM   I spent a total of  40 Minutes  in face to face in clinical consultation, greater than 50% of which was counseling/coordinating care for uterine fibroids.

## 2022-07-14 NOTE — Discharge Instructions (Signed)
Uterine Artery Embolization After Care   What can I expect after the procedure?   After the procedure, it is common to have:   Mild pain or discomfort at the arterial entry site   Uterine Cramping   Cramps can vary in strength from what you would consider to be a bad menstrual cycle all the way up to as severe as labor pains.   The cramping is typically the most severe the afternoon and evening the day of the procedure and begin to improve the next day and each day thereafter.   Vaginal bleeding. Your cycle may become irregular the first several months.   Vaginal discharge. We recommend you wear a panty liner for first 4-6 weeks following your procedure.    Nausea and vomiting.      Follow these instructions at home:   Medicines   Take your medicine exactly as told, at the same time every day. This is vital to helping you with a smooth recovery.   Zofran (ondansetron) is used to prevent nausea before it starts.  You will have a prescription to take 8 mg of this medication every 8 hours.  You should take this even if you don't feel nauseated because it is meant to prevent the nausea from occurring.  Once you get nauseated and start to vomit, you may not be able to keep your other medicines down and your pain can be left untreated.  You can take this with every other dose of the oxycodone/acetaminophen   Naproxen is a non-steroidal anti-inflammatory medicine called and is critical in keeping your inflammation and pain under control.  You must take this every 12 hrs. We recommend 8 am and 8 pm.    Percocet (oxycodone/acetaminophen) is a combination narcotic pain medication with Tylenol.  This is to help with your pain.  Take it every 4 hours regardless of your pain level the first 2 days.  Set an alarm to wake up so you don't miss a dose overnight and get behind on your pain control.  After the first 48 hrs, if your pain is minimal you can take only as needed.    Colace (docusate  sodium) is a stool softener to help prevent constipation.  The pain medications often cause constipation which can be particularly uncomfortable after [place].  We recommend you take this at 8 am and 8 pm with your naproxen.    Phenergan (promethazine) is another medication for nausea.  If you still feel sick to your stomach or vomit despite the Zofran (ondansetron) take this medicine every 8 hours as needed.    Incision care   Leave your bandage on for 24 hrs and keep the area dry   You may remove the bandage after 24 hrs and then shower as normal.    Do not submerge in a bath, pool or hot tub until the small incision is completely healed (5-7 days).   Activity   Do not lift anything that is heavier than 5 lb (2.3 kg)for the first 3 days.     Return to your normal activities after day 5.  Take it slowly and listen to your body. Ask your health care provider what activities are safe for you.   General instructions   Many women find a hot water bottle or heating pad helpful when placed on the lower abdomen.  This is fine to do if it helps your cramps.    Do not use any products that contain nicotine or tobacco.   These products include cigarettes, chewing tobacco, and vaping devices, such as e-cigarettes. These can delay incision healing. If you need help quitting, ask your health care provider.   Do not have sex or put anything in your vagina for at least 14 days.    Do not drink alcohol until your health care provider says it is okay.   Keep all follow-up visits. This is important.      Please contact our office at [phone] for the following symptoms:   You have a fever.   You have more redness, swelling, or pain around your incision.   You have more fluid or blood coming from your incision site.   Your incision feels warm to the touch.   You have pus or a bad smell coming from your incision or vagina.   You have a rash.   You have nausea, or you cannot eat or drink  anything without vomiting.   You have a vaginal discharge that is not getting lighter.   Get help right away if:   You have trouble breathing.   You have chest pain.   You have severe pain in your abdomen, and it does not get better with medicine.   You have leg pain or leg swelling.   You feel dizzy, or you faint.   Do not wait to see if the symptoms will go away.   Do not drive yourself to the hospital.      These symptoms may be an emergency.    Get help right away. Call 911.   Summary   After the procedure, it is common to have cramps, or pain or discomfort at the incision site. You will be given pain medicine.   Follow instructions from your health care provider about how to take care of your incision. Check your incision area every day for signs of infection.   Take over-the-counter and prescription medicines only as told by your health care provider.   Contact your health care provider if you have symptoms of infection or other symptoms that do not get better with treatment.   Thanks for visiting [facility]! 

## 2022-07-15 ENCOUNTER — Other Ambulatory Visit: Payer: Self-pay | Admitting: Interventional Radiology

## 2022-07-15 ENCOUNTER — Telehealth: Payer: Self-pay

## 2022-07-15 ENCOUNTER — Ambulatory Visit
Admission: RE | Admit: 2022-07-15 | Discharge: 2022-07-15 | Disposition: A | Discharge status: Home / Self Care | Payer: No Typology Code available for payment source | Source: Ambulatory Visit | Attending: Interventional Radiology | Admitting: Interventional Radiology

## 2022-07-15 DIAGNOSIS — D259 Leiomyoma of uterus, unspecified: Secondary | ICD-10-CM

## 2022-07-15 HISTORY — PX: IR EMBO TUMOR ORGAN ISCHEMIA INFARCT INC GUIDE ROADMAPPING: IMG5449

## 2022-07-15 HISTORY — PX: IR FLUORO GUIDED NEEDLE PLC ASPIRATION/INJECTION LOC: IMG2395

## 2022-07-15 MED ORDER — PANTOPRAZOLE SODIUM 40 MG IV SOLR
40.0000 mg | Freq: Once | INTRAVENOUS | Status: AC
  Administered 2022-07-15: 40 mg via INTRAVENOUS

## 2022-07-15 MED ORDER — ONDANSETRON HCL 4 MG PO TABS: 4.0000 mg | ORAL_TABLET | Freq: Three times a day (TID) | ORAL | 0 refills | Status: AC | PRN

## 2022-07-15 MED ORDER — DOCUSATE SODIUM 100 MG PO CAPS: 100.0000 mg | ORAL_CAPSULE | Freq: Two times a day (BID) | ORAL | 0 refills | Status: AC

## 2022-07-15 MED ORDER — ACETAMINOPHEN 10 MG/ML IV SOLN
1000.0000 mg | Freq: Once | INTRAVENOUS | Status: AC
  Administered 2022-07-15: 1000 mg via INTRAVENOUS

## 2022-07-15 MED ORDER — FENTANYL CITRATE PF 50 MCG/ML IJ SOSY
25.0000 ug | PREFILLED_SYRINGE | INTRAMUSCULAR | Status: DC | PRN
  Administered 2022-07-15 (×4): 25 ug via INTRAVENOUS

## 2022-07-15 MED ORDER — HYDROMORPHONE HCL 1 MG/ML IJ SOLN
1.0000 mg | Freq: Once | INTRAMUSCULAR | Status: AC
  Administered 2022-07-15: 1 mg via INTRAVENOUS

## 2022-07-15 MED ORDER — MIDAZOLAM HCL 2 MG/2ML IJ SOLN
1.0000 mg | INTRAMUSCULAR | Status: DC | PRN
  Administered 2022-07-15: 1 mg via INTRAVENOUS
  Administered 2022-07-15 (×3): 0.5 mg via INTRAVENOUS

## 2022-07-15 MED ORDER — CEFAZOLIN SODIUM-DEXTROSE 2-4 GM/100ML-% IV SOLN
2.0000 g | INTRAVENOUS | Status: AC
  Administered 2022-07-15: 2 g via INTRAVENOUS

## 2022-07-15 MED ORDER — KETOROLAC TROMETHAMINE 30 MG/ML IJ SOLN
30.0000 mg | Freq: Once | INTRAMUSCULAR | Status: AC
Start: 2022-07-15 — End: 2022-07-15
  Administered 2022-07-15: 30 mg via INTRAMUSCULAR

## 2022-07-15 MED ORDER — PROMETHAZINE HCL 25 MG/ML IJ SOLN: 25.0000 mg | Freq: Once | INTRAMUSCULAR | Status: DC

## 2022-07-15 MED ORDER — PROMETHAZINE HCL 12.5 MG PO TABS: 12.5000 mg | ORAL_TABLET | Freq: Four times a day (QID) | ORAL | 0 refills | Status: AC | PRN

## 2022-07-15 MED ORDER — SODIUM CHLORIDE 0.9 % IV SOLN
10.0000 mg | Freq: Once | INTRAVENOUS | Status: AC
  Administered 2022-07-15: 10 mg via INTRAVENOUS

## 2022-07-15 MED ORDER — SODIUM CHLORIDE 0.9 % IV SOLN: INTRAVENOUS | Status: DC

## 2022-07-15 MED ORDER — NAPROXEN SODIUM 550 MG PO TABS: 550.0000 mg | ORAL_TABLET | Freq: Two times a day (BID) | ORAL | 0 refills | Status: AC

## 2022-07-15 MED ORDER — PROMETHAZINE HCL 25 MG/ML IJ SOLN
25.0000 mg | Freq: Once | INTRAMUSCULAR | Status: AC
  Administered 2022-07-15: 25 mg via INTRAMUSCULAR

## 2022-07-15 MED ORDER — KETOROLAC TROMETHAMINE 30 MG/ML IJ SOLN
30.0000 mg | Freq: Once | INTRAMUSCULAR | Status: AC
  Administered 2022-07-15: 30 mg via INTRAVENOUS

## 2022-07-15 MED ORDER — ONDANSETRON HCL 4 MG/2ML IJ SOLN
8.0000 mg | Freq: Once | INTRAMUSCULAR | Status: AC
  Administered 2022-07-15: 8 mg via INTRAVENOUS

## 2022-07-15 NOTE — Telephone Encounter (Signed)
Pt. Is to take Naproxen 500 mg twice a day for 7 days. Colace 100 mg twice a day for 7 days. Percocet 7.5/325mg every 4 hours for 5 days for pain. Zofran 8mg every 8 hours for three days and then as needed for nausea/vomiting. Phenergan 12.5 mg every four hours as needed for nausea/vomiting.  

## 2022-07-15 NOTE — Progress Notes (Signed)
Pt back in nursing recovery area. Pt still drowsy from procedure but will wake up when spoken to. Pt follows commands, talks in complete sentences and has no complaints at this time. Pt will remain in nursing station until discharge.  ?

## 2022-07-16 ENCOUNTER — Telehealth: Payer: Self-pay

## 2022-07-16 NOTE — Telephone Encounter (Signed)
Phone call to pt to follow up from her [place] on 07/15/22. Pt. Reports that she has had no pain. Pt. Reports being able to keep liquids and some solid foods down despite a low appetite. Pt. Denies any signs of bleeding, infection, redness at the right femoral site, drainage at the site, fever, nausea, or vomiting. Pt has no complaints at this time. Dr. Archer Asa will call her within the week to check on her status post-procedure. Pt advised to call back if anything were to change or any concerns arise and we will arrange an in person appointment. Pt verbalized understanding.

## 2022-07-22 ENCOUNTER — Telehealth: Payer: Self-pay

## 2022-07-22 NOTE — Telephone Encounter (Signed)
Phone call to pt to follow up from her [place] on 07/15/22. Pt. Reports no pain 1 week post-op, and no signs of infection at her femoral site. Dr. Archer Asa will call again in a couple weeks to ensure the pt. Is still doing well. Pt advised to call back if anything were to change or any concerns arise and we will arrange an in person appointment. Pt verbalized understanding.

## 2022-07-27 ENCOUNTER — Ambulatory Visit
Admission: RE | Admit: 2022-07-27 | Discharge: 2022-07-27 | Disposition: A | Discharge status: Home / Self Care | Payer: No Typology Code available for payment source | Source: Ambulatory Visit | Attending: Interventional Radiology | Admitting: Interventional Radiology

## 2022-07-27 ENCOUNTER — Other Ambulatory Visit: Payer: Self-pay | Admitting: Interventional Radiology

## 2022-07-27 DIAGNOSIS — D250 Submucous leiomyoma of uterus: Secondary | ICD-10-CM

## 2022-07-27 HISTORY — PX: IR RADIOLOGIST EVAL & MGMT: IMG5224

## 2022-07-27 NOTE — Progress Notes (Signed)
Chief Complaint: Patient was consulted remotely today (TeleHealth) for  at the request of Tammra Pressman K.    Referring Physician(s): Cherly Hensen, Sheronette   History of Present Illness: Leah Robertson is a 47 y.o. female With highly symptomatic uterine fibroids.  She underwent superior hypogastric nerve block and bilateral uterine artery embolization on 07/15/2022.  We spoke over the phone today for her 2-week follow-up appointment.  She reports that she has fully recovered.  Her recovery was quite easy.  She never had severe pain.  She is back to full activities including exercise and work.  Currently, she has no complaints.  She had a 1 day episode of bleeding at the beginning of the month which corresponded with the expected start date of her cycle.  This was very light and uneventful.  No other issues at this time.  Past Medical History:  Diagnosis Date   Anemia    PCOS (polycystic ovarian syndrome)     Past Surgical History:  Procedure Laterality Date   BREAST BIOPSY Left 06/2016   x2   CERVICAL CONIZATION W/BX N/A 09/19/2019   Procedure: CONIZATION CERVIX WITH BIOPSY;  Surgeon: Geryl Rankins, MD;  Location: [facility];  Service: Gynecology;  Laterality: N/A;   CESAREAN SECTION     DILATATION & CURETTAGE/HYSTEROSCOPY WITH MYOSURE N/A 03/11/2021   Procedure: DILATATION & CURETTAGE/HYSTEROSCOPY WITH MYOSURE RESECTION OF SUBMUCOSAL FIBROID;  Surgeon: Steva Ready, DO;  Location: [facility];  Service: Gynecology;  Laterality: N/A;   INTRAUTERINE DEVICE (IUD) INSERTION N/A 03/11/2021   Procedure: INTRAUTERINE DEVICE (IUD) INSERTION;  Surgeon: Steva Ready, DO;  Location: [facility];  Service: Gynecology;  Laterality: N/A;   IR EMBO TUMOR ORGAN ISCHEMIA INFARCT INC GUIDE ROADMAPPING  07/15/2022   IR FLUORO GUIDED NEEDLE PLC ASPIRATION/INJECTION LOC  07/15/2022   IR RADIOLOGIST EVAL & MGMT  06/29/2022   IR RADIOLOGIST EVAL &  MGMT  07/27/2022    Allergies: Patient has no known allergies.  Medications: Prior to Admission medications   Medication Sig Start Date End Date Taking? Authorizing Provider  clobetasol ointment (TEMOVATE) 0.05 %  12/04/17   [provider]  docusate sodium (COLACE) 100 MG capsule Take 1 capsule (100 mg total) by mouth 2 (two) times daily. 07/15/22   Sterling Big, MD  Fe Fum-FePoly-Vit C-Vit B3 (INTEGRA PO) Take by mouth.    [provider]  ibuprofen (ADVIL) 800 MG tablet Take 1 tablet (800 mg total) by mouth every 8 (eight) hours as needed. 03/11/21   Steva Ready, DO  Multiple Vitamin (MULTIVITAMIN) tablet Take 1 tablet by mouth daily.    [provider]  naproxen sodium (ANAPROX DS) 550 MG tablet Take 1 tablet (550 mg total) by mouth 2 (two) times daily with a meal. 07/15/22   Sterling Big, MD  ondansetron (ZOFRAN) 4 MG tablet Take 1 tablet (4 mg total) by mouth every 8 (eight) hours as needed for nausea or vomiting. 07/15/22   Sterling Big, MD  promethazine (PHENERGAN) 12.5 MG tablet Take 1 tablet (12.5 mg total) by mouth every 6 (six) hours as needed for nausea or vomiting. 07/15/22   Sterling Big, MD     Family History  Problem Relation Age of Onset   Colon cancer Mother    Stroke Mother    Prostate cancer Father    Heart disease Father    Healthy Sister    Hypertension Brother    Diabetes Other  Social History   Socioeconomic History   Marital status: Married    Spouse name: Not on file   Number of children: Not on file   Years of education: Not on file   Highest education level: Not on file  Occupational History   Not on file  Tobacco Use   Smoking status: Never   Smokeless tobacco: Never  Vaping Use   Vaping Use: Never used  Substance and Sexual Activity   Alcohol use: Never   Drug use: Never   Sexual activity: Not on file  Other Topics Concern   Not on file  Social History Narrative   She works as a  Emergency planning/management officer.     She lives with husband and two children   Highest level of education:  BS   Social Determinants of Health   Financial Resource Strain: Not on file  Food Insecurity: Not on file  Transportation Needs: Not on file  Physical Activity: Not on file  Stress: Not on file  Social Connections: Not on file     Review of Systems  Review of Systems: A 12 point ROS discussed and pertinent positives are indicated in the HPI above.  All other systems are negative.    Physical Exam No direct physical exam was performed (except for noted visual exam findings with Video Visits).    Vital Signs: There were no vitals taken for this visit.  Imaging:   Labs:  CBC: No results for input(s): "WBC", "HGB", "HCT", "PLT" in the last 8760 hours.  COAGS: No results for input(s): "INR", "APTT" in the last 8760 hours.  BMP: No results for input(s): "NA", "K", "CL", "CO2", "GLUCOSE", "BUN", "CALCIUM", "CREATININE", "GFRNONAA", "GFRAA" in the last 8760 hours.  Invalid input(s): "CMP"  LIVER FUNCTION TESTS: No results for input(s): "BILITOT", "AST", "ALT", "ALKPHOS", "PROT", "ALBUMIN" in the last 8760 hours.  TUMOR MARKERS: No results for input(s): "AFPTM", "CEA", "CA199", "CHROMGRNA" in the last 8760 hours.  Assessment and Plan:  Very pleasant 47 year old female doing exceptionally well 2 weeks status post superior hypogastric nerve block and bilateral uterine artery embolization.  She has returned to full activities and is completely recovered.  No adverse events.  1.) Follow-up visit at 3 months post procedure.     Electronically Signed: Sterling Big 07/27/2022, 1:27 PM   I spent a total of  5 Minutes in remote  clinical consultation, greater than 50% of which was counseling/coordinating care for post [place].    Visit type: Audio only (telephone). Audio (no video) only due to patient preference. Alternative for in-person consultation at [facility], [address] [place], [place], [place]. This visit type was conducted due to national recommendations for restrictions regarding the COVID-19 Pandemic (e.g. social distancing).  This format is felt to be most appropriate for this patient at this time.  All issues noted in this document were discussed and addressed.

## 2022-09-03 ENCOUNTER — Other Ambulatory Visit: Payer: Self-pay | Admitting: Family Medicine

## 2022-09-03 DIAGNOSIS — Z1231 Encounter for screening mammogram for malignant neoplasm of breast: Secondary | ICD-10-CM

## 2022-09-08 ENCOUNTER — Ambulatory Visit
Admission: RE | Admit: 2022-09-08 | Discharge: 2022-09-08 | Disposition: A | Discharge status: Home / Self Care | Payer: No Typology Code available for payment source | Source: Ambulatory Visit | Attending: Family Medicine | Admitting: Family Medicine

## 2022-09-08 DIAGNOSIS — Z1231 Encounter for screening mammogram for malignant neoplasm of breast: Secondary | ICD-10-CM

## 2022-10-13 ENCOUNTER — Other Ambulatory Visit: Payer: Self-pay | Admitting: Interventional Radiology

## 2022-10-13 DIAGNOSIS — D250 Submucous leiomyoma of uterus: Secondary | ICD-10-CM

## 2022-10-19 ENCOUNTER — Ambulatory Visit
Admission: RE | Admit: 2022-10-19 | Discharge: 2022-10-19 | Disposition: A | Discharge status: Home / Self Care | Payer: No Typology Code available for payment source | Source: Ambulatory Visit | Attending: Interventional Radiology | Admitting: Interventional Radiology

## 2022-10-19 DIAGNOSIS — D250 Submucous leiomyoma of uterus: Secondary | ICD-10-CM

## 2022-10-19 HISTORY — PX: IR RADIOLOGIST EVAL & MGMT: IMG5224

## 2022-10-19 NOTE — Progress Notes (Signed)
Chief Complaint: Patient was consulted remotely today (TeleHealth) for uterine fibroids post [place] at the request of Vaniyah Lansky K.    Referring Physician(s): Cherly Hensen, Sheronette   History of Present Illness: Leah Robertson is a 47 y.o. female with highly symptomatic uterine fibroids.  She underwent superior hypogastric nerve block and bilateral uterine artery embolization on 07/15/2022.  We spoke over the phone today for her 3 month follow-up appointment.   Leah Robertson reports that she is doing great.  Her cycles are more regular and less heavy.  Overall she is very pleased and can feel a definite benefit.  She does mention that since the procedure she has a very small amount of discomfort during orgasm.  She rates this as a 0.5 out of 10 on a 10 point scale.  This does not seem to be impacting her quality of life, she only mentions it to ask if this is normal.  Past Medical History:  Diagnosis Date   Anemia    PCOS (polycystic ovarian syndrome)     Past Surgical History:  Procedure Laterality Date   BREAST BIOPSY Left 06/2016   x2   CERVICAL CONIZATION W/BX N/A 09/19/2019   Procedure: CONIZATION CERVIX WITH BIOPSY;  Surgeon: Geryl Rankins, MD;  Location: [facility];  Service: Gynecology;  Laterality: N/A;   CESAREAN SECTION     DILATATION & CURETTAGE/HYSTEROSCOPY WITH MYOSURE N/A 03/11/2021   Procedure: DILATATION & CURETTAGE/HYSTEROSCOPY WITH MYOSURE RESECTION OF SUBMUCOSAL FIBROID;  Surgeon: Steva Ready, DO;  Location: [facility];  Service: Gynecology;  Laterality: N/A;   INTRAUTERINE DEVICE (IUD) INSERTION N/A 03/11/2021   Procedure: INTRAUTERINE DEVICE (IUD) INSERTION;  Surgeon: Steva Ready, DO;  Location: [facility];  Service: Gynecology;  Laterality: N/A;   IR EMBO TUMOR ORGAN ISCHEMIA INFARCT INC GUIDE ROADMAPPING  07/15/2022   IR FLUORO GUIDED NEEDLE PLC ASPIRATION/INJECTION LOC  07/15/2022   IR RADIOLOGIST  EVAL & MGMT  06/29/2022   IR RADIOLOGIST EVAL & MGMT  07/27/2022   IR RADIOLOGIST EVAL & MGMT  10/19/2022    Allergies: Patient has no known allergies.  Medications: Prior to Admission medications   Medication Sig Start Date End Date Taking? Authorizing Provider  clobetasol ointment (TEMOVATE) 0.05 %  12/04/17   [provider]  docusate sodium (COLACE) 100 MG capsule Take 1 capsule (100 mg total) by mouth 2 (two) times daily. 07/15/22   Sterling Big, MD  Fe Fum-FePoly-Vit C-Vit B3 (INTEGRA PO) Take by mouth.    [provider]  ibuprofen (ADVIL) 800 MG tablet Take 1 tablet (800 mg total) by mouth every 8 (eight) hours as needed. 03/11/21   Steva Ready, DO  Multiple Vitamin (MULTIVITAMIN) tablet Take 1 tablet by mouth daily.    [provider]  naproxen sodium (ANAPROX DS) 550 MG tablet Take 1 tablet (550 mg total) by mouth 2 (two) times daily with a meal. 07/15/22   Sterling Big, MD  ondansetron (ZOFRAN) 4 MG tablet Take 1 tablet (4 mg total) by mouth every 8 (eight) hours as needed for nausea or vomiting. 07/15/22   Sterling Big, MD  promethazine (PHENERGAN) 12.5 MG tablet Take 1 tablet (12.5 mg total) by mouth every 6 (six) hours as needed for nausea or vomiting. 07/15/22   Sterling Big, MD     Family History  Problem Relation Age of Onset   Colon cancer Mother    Stroke Mother    Prostate cancer Father  Heart disease Father    Healthy Sister    Hypertension Brother    Diabetes Other     Social History   Socioeconomic History   Marital status: Married    Spouse name: Not on file   Number of children: Not on file   Years of education: Not on file   Highest education level: Not on file  Occupational History   Not on file  Tobacco Use   Smoking status: Never   Smokeless tobacco: Never  Vaping Use   Vaping status: Never Used  Substance and Sexual Activity   Alcohol use: Never   Drug use: Never   Sexual activity: Not  on file  Other Topics Concern   Not on file  Social History Narrative   She works as a Emergency planning/management officer.     She lives with husband and two children   Highest level of education:  BS   Social Determinants of Health   Financial Resource Strain: Not on file  Food Insecurity: Not on file  Transportation Needs: Not on file  Physical Activity: Not on file  Stress: Not on file  Social Connections: Unknown (07/29/2021)   [facility] from [facility]   Social Network    Social Network: Not on file    Review of Systems  Review of Systems: A 12 point ROS discussed and pertinent positives are indicated in the HPI above.  All other systems are negative.    Physical Exam No direct physical exam was performed (except for noted visual exam findings with Video Visits).    Vital Signs: There were no vitals taken for this visit.  Imaging: IR Radiologist Eval & Mgmt  Result Date: 10/19/2022 EXAM: ESTABLISHED PATIENT OFFICE VISIT CHIEF COMPLAINT: SEE EPIC NOTE HISTORY OF PRESENT ILLNESS: SEE EPIC NOTE REVIEW OF SYSTEMS: SEE EPIC NOTE PHYSICAL EXAMINATION: SEE EPIC NOTE ASSESSMENT AND PLAN: SEE EPIC NOTE Electronically Signed   By: Malachy Moan M.D.   On: 10/19/2022 08:23    Labs:  CBC: No results for input(s): "WBC", "HGB", "HCT", "PLT" in the last 8760 hours.  COAGS: No results for input(s): "INR", "APTT" in the last 8760 hours.  BMP: No results for input(s): "NA", "K", "CL", "CO2", "GLUCOSE", "BUN", "CALCIUM", "CREATININE", "GFRNONAA", "GFRAA" in the last 8760 hours.  Invalid input(s): "CMP"  LIVER FUNCTION TESTS: No results for input(s): "BILITOT", "AST", "ALT", "ALKPHOS", "PROT", "ALBUMIN" in the last 8760 hours.  TUMOR MARKERS: No results for input(s): "AFPTM", "CEA", "CA199", "CHROMGRNA" in the last 8760 hours.  Assessment and Plan:  Very pleasant 47 year old female 4-month status post uterine artery embolization.  She is doing very well and has no  complaints.  1.)  Follow-up visit in late October 2024 for 68-month follow-up evaluation.    Electronically Signed: Sterling Big 10/19/2022, 8:55 AM   I spent a total of  10 Minutes in remote  clinical consultation, greater than 50% of which was counseling/coordinating care for uterine fibroids post [place].    Visit type: Audio only (telephone). Audio (no video) only due to patient preference. Alternative for in-person consultation at [facility], [address] [place], [place], [place]. This visit type was conducted due to national recommendations for restrictions regarding the COVID-19 Pandemic (e.g. social distancing).  This format is felt to be most appropriate for this patient at this time.  All issues noted in this document were discussed and addressed.

## 2022-12-28 ENCOUNTER — Other Ambulatory Visit: Payer: Self-pay | Admitting: Interventional Radiology

## 2022-12-28 DIAGNOSIS — D250 Submucous leiomyoma of uterus: Secondary | ICD-10-CM

## 2022-12-30 ENCOUNTER — Ambulatory Visit
Admission: RE | Admit: 2022-12-30 | Discharge: 2022-12-30 | Disposition: A | Discharge status: Home / Self Care | Payer: No Typology Code available for payment source | Source: Ambulatory Visit | Attending: Interventional Radiology | Admitting: Interventional Radiology

## 2022-12-30 DIAGNOSIS — D250 Submucous leiomyoma of uterus: Secondary | ICD-10-CM

## 2022-12-30 HISTORY — PX: IR RADIOLOGIST EVAL & MGMT: IMG5224

## 2022-12-30 NOTE — Progress Notes (Signed)
Chief Complaint: Patient was consulted remotely today (TeleHealth) for uterine fibroids at the request of [name].    Referring Physician(s): Claritza July K  History of Present Illness: Leah Robertson is a 47 y.o. female with a history of highly symptomatic uterine fibroids.  She underwent bilateral uterine artery embolization on 07/15/2022.  We spoke over the phone today for her 32-month follow-up evaluation.  [name] tells me that she is doing extremely well.  All of her preembolization symptoms are significantly improved and she is enjoying her improved quality of life.  She has no complaints at this time and feels very satisfied with the outcome of her uterine artery embolization procedure.  Past Medical History:  Diagnosis Date   Anemia    PCOS (polycystic ovarian syndrome)     Past Surgical History:  Procedure Laterality Date   BREAST BIOPSY Left 06/2016   x2   CERVICAL CONIZATION W/BX N/A 09/19/2019   Procedure: CONIZATION CERVIX WITH BIOPSY;  Surgeon: Geryl Rankins, MD;  Location: [facility];  Service: Gynecology;  Laterality: N/A;   CESAREAN SECTION     DILATATION & CURETTAGE/HYSTEROSCOPY WITH MYOSURE N/A 03/11/2021   Procedure: DILATATION & CURETTAGE/HYSTEROSCOPY WITH MYOSURE RESECTION OF SUBMUCOSAL FIBROID;  Surgeon: Steva Ready, DO;  Location: [facility];  Service: Gynecology;  Laterality: N/A;   INTRAUTERINE DEVICE (IUD) INSERTION N/A 03/11/2021   Procedure: INTRAUTERINE DEVICE (IUD) INSERTION;  Surgeon: Steva Ready, DO;  Location: [facility];  Service: Gynecology;  Laterality: N/A;   IR EMBO TUMOR ORGAN ISCHEMIA INFARCT INC GUIDE ROADMAPPING  07/15/2022   IR FLUORO GUIDED NEEDLE PLC ASPIRATION/INJECTION LOC  07/15/2022   IR RADIOLOGIST EVAL & MGMT  06/29/2022   IR RADIOLOGIST EVAL & MGMT  07/27/2022   IR RADIOLOGIST EVAL & MGMT  10/19/2022   IR RADIOLOGIST EVAL & MGMT  12/30/2022     Allergies: Patient has no known allergies.  Medications: Prior to Admission medications   Medication Sig Start Date End Date Taking? Authorizing Provider  clobetasol ointment (TEMOVATE) 0.05 %  12/04/17   [provider]  docusate sodium (COLACE) 100 MG capsule Take 1 capsule (100 mg total) by mouth 2 (two) times daily. 07/15/22   Sterling Big, MD  Fe Fum-FePoly-Vit C-Vit B3 (INTEGRA PO) Take by mouth.    [provider]  ibuprofen (ADVIL) 800 MG tablet Take 1 tablet (800 mg total) by mouth every 8 (eight) hours as needed. 03/11/21   Steva Ready, DO  Multiple Vitamin (MULTIVITAMIN) tablet Take 1 tablet by mouth daily.    [provider]  naproxen sodium (ANAPROX DS) 550 MG tablet Take 1 tablet (550 mg total) by mouth 2 (two) times daily with a meal. 07/15/22   Sterling Big, MD  ondansetron (ZOFRAN) 4 MG tablet Take 1 tablet (4 mg total) by mouth every 8 (eight) hours as needed for nausea or vomiting. 07/15/22   Sterling Big, MD  promethazine (PHENERGAN) 12.5 MG tablet Take 1 tablet (12.5 mg total) by mouth every 6 (six) hours as needed for nausea or vomiting. 07/15/22   Sterling Big, MD     Family History  Problem Relation Age of Onset   Colon cancer Mother    Stroke Mother    Prostate cancer Father    Heart disease Father    Healthy Sister    Hypertension Brother    Diabetes Other     Social History   Socioeconomic History  Marital status: Married    Spouse name: Not on file   Number of children: Not on file   Years of education: Not on file   Highest education level: Not on file  Occupational History   Not on file  Tobacco Use   Smoking status: Never   Smokeless tobacco: Never  Vaping Use   Vaping status: Never Used  Substance and Sexual Activity   Alcohol use: Never   Drug use: Never   Sexual activity: Not on file  Other Topics Concern   Not on file  Social History Narrative   She works as a Merchant navy officer.     She lives with husband and two children   Highest level of education:  BS   Social Determinants of Health   Financial Resource Strain: Not on file  Food Insecurity: Not on file  Transportation Needs: Not on file  Physical Activity: Not on file  Stress: Not on file  Social Connections: Unknown (07/29/2021)   Received [facility]   Social Network    Social Network: Not on file    Review of Systems  Review of Systems: A 12 point ROS discussed and pertinent positives are indicated in the HPI above.  All other systems are negative.    Physical Exam No direct physical exam was performed (except for noted visual exam findings with Video Visits).    Vital Signs: There were no vitals taken for this visit.  Imaging: IR Radiologist Eval & Mgmt  Result Date: 12/30/2022 EXAM: ESTABLISHED PATIENT OFFICE VISIT CHIEF COMPLAINT: SEE NOTE IN EPIC HISTORY OF PRESENT ILLNESS: SEE NOTE IN EPIC REVIEW OF SYSTEMS: SEE NOTE IN EPIC PHYSICAL EXAMINATION: SEE NOTE IN EPIC ASSESSMENT AND PLAN: SEE NOTE IN EPIC Electronically Signed   By: Malachy Moan M.D.   On: 12/30/2022 10:49    Labs:  CBC: No results for input(s): "WBC", "HGB", "HCT", "PLT" in the last 8760 hours.  COAGS: No results for input(s): "INR", "APTT" in the last 8760 hours.  BMP: No results for input(s): "NA", "K", "CL", "CO2", "GLUCOSE", "BUN", "CALCIUM", "CREATININE", "GFRNONAA", "GFRAA" in the last 8760 hours.  Invalid input(s): "CMP"  LIVER FUNCTION TESTS: No results for input(s): "BILITOT", "AST", "ALT", "ALKPHOS", "PROT", "ALBUMIN" in the last 8760 hours.  TUMOR MARKERS: No results for input(s): "AFPTM", "CEA", "CA199", "CHROMGRNA" in the last 8760 hours.  Assessment and Plan:  47 year old female now 6 months status post bilateral uterine artery embolization.  She is doing extremely well and has no active complaints at this time.  She feels that her overall symptoms are  significantly improved and she is very pleased.  No more scheduled follow-up with interventional radiology.  [name] knows that we are happy to see her anytime in the future should she develop recurrent symptoms or further need of our services.   Thank you for this interesting consult.  I greatly enjoyed meeting [employer] and look forward to participating in their care.  A copy of this report was sent to the requesting provider on this date.  Electronically Signed: Kandis Cocking Azelie Noguera 12/30/2022, 10:59 AM   I spent a total of 15 Minutes in remote  clinical consultation, greater than 50% of which was counseling/coordinating care for uterine firboids.    Visit type: Audio only (telephone). Audio (no video) only due to patient preference. Alternative for in-person consultation at [facility], [address] [place], [place], [place]. This visit type was conducted due to national recommendations  for restrictions regarding the COVID-19 Pandemic (e.g. social distancing).  This format is felt to be most appropriate for this patient at this time.  All issues noted in this document were discussed and addressed.

## 2023-07-26 ENCOUNTER — Other Ambulatory Visit: Payer: Self-pay | Admitting: Family Medicine

## 2023-07-26 DIAGNOSIS — Z1231 Encounter for screening mammogram for malignant neoplasm of breast: Secondary | ICD-10-CM

## 2023-07-29 ENCOUNTER — Other Ambulatory Visit: Payer: Self-pay | Admitting: Family Medicine

## 2023-07-29 DIAGNOSIS — M7989 Other specified soft tissue disorders: Secondary | ICD-10-CM

## 2023-08-18 ENCOUNTER — Other Ambulatory Visit: Payer: Self-pay | Admitting: Family Medicine

## 2023-08-18 ENCOUNTER — Ambulatory Visit
Admission: RE | Admit: 2023-08-18 | Discharge: 2023-08-18 | Disposition: A | Discharge status: Home / Self Care | Source: Ambulatory Visit | Attending: Family Medicine

## 2023-08-18 ENCOUNTER — Ambulatory Visit
Admission: RE | Admit: 2023-08-18 | Discharge: 2023-08-18 | Disposition: A | Discharge status: Home / Self Care | Source: Ambulatory Visit | Attending: Family Medicine | Admitting: Family Medicine

## 2023-08-18 DIAGNOSIS — M7989 Other specified soft tissue disorders: Secondary | ICD-10-CM
